# Patient Record
Sex: Female | Born: 1970 | Race: White | Hispanic: No | Smoking: Heavy tobacco smoker
Health system: Southern US, Community
[De-identification: ages and names within clinical notes are randomized; demographics above are authoritative.]

## PROBLEM LIST (undated history)

## (undated) DIAGNOSIS — N200 Calculus of kidney: Secondary | ICD-10-CM

## (undated) DIAGNOSIS — N939 Abnormal uterine and vaginal bleeding, unspecified: Secondary | ICD-10-CM

## (undated) DIAGNOSIS — E785 Hyperlipidemia, unspecified: Secondary | ICD-10-CM

## (undated) DIAGNOSIS — K219 Gastro-esophageal reflux disease without esophagitis: Secondary | ICD-10-CM

## (undated) HISTORY — DX: Calculus of kidney: N20.0

## (undated) HISTORY — DX: Hyperlipidemia, unspecified: E78.5

## (undated) HISTORY — DX: Gastro-esophageal reflux disease without esophagitis: K21.9

## (undated) HISTORY — DX: Abnormal uterine and vaginal bleeding, unspecified: N93.9

---

## 2005-04-04 ENCOUNTER — Emergency Department: Payer: Self-pay | Admitting: Emergency Medicine

## 2006-04-27 ENCOUNTER — Ambulatory Visit: Payer: Self-pay

## 2007-08-08 ENCOUNTER — Ambulatory Visit: Payer: Self-pay | Admitting: Family Medicine

## 2007-08-21 ENCOUNTER — Ambulatory Visit: Payer: Self-pay | Admitting: Family Medicine

## 2007-09-13 ENCOUNTER — Ambulatory Visit: Payer: Self-pay | Admitting: Gastroenterology

## 2008-01-23 ENCOUNTER — Emergency Department: Payer: Self-pay | Admitting: Emergency Medicine

## 2008-01-23 ENCOUNTER — Other Ambulatory Visit: Payer: Self-pay

## 2008-01-23 ENCOUNTER — Ambulatory Visit: Payer: Self-pay

## 2008-01-31 ENCOUNTER — Ambulatory Visit: Payer: Self-pay | Admitting: Nurse Practitioner

## 2009-01-06 ENCOUNTER — Ambulatory Visit: Payer: Self-pay | Admitting: Family Medicine

## 2009-07-03 ENCOUNTER — Ambulatory Visit: Payer: Self-pay | Admitting: Family Medicine

## 2009-07-07 ENCOUNTER — Ambulatory Visit: Payer: Self-pay | Admitting: Nurse Practitioner

## 2010-05-09 HISTORY — PX: CHOLECYSTECTOMY: SHX55

## 2010-09-03 ENCOUNTER — Emergency Department: Payer: Self-pay | Admitting: Emergency Medicine

## 2010-09-16 ENCOUNTER — Ambulatory Visit: Payer: Self-pay | Admitting: Surgery

## 2011-10-31 ENCOUNTER — Ambulatory Visit: Payer: Self-pay

## 2012-11-06 ENCOUNTER — Ambulatory Visit: Payer: Self-pay

## 2012-11-07 ENCOUNTER — Ambulatory Visit: Payer: Self-pay

## 2013-05-14 ENCOUNTER — Ambulatory Visit: Payer: Self-pay

## 2013-07-18 DIAGNOSIS — E7800 Pure hypercholesterolemia, unspecified: Secondary | ICD-10-CM | POA: Insufficient documentation

## 2013-11-13 ENCOUNTER — Ambulatory Visit: Payer: Self-pay

## 2013-12-11 ENCOUNTER — Emergency Department: Payer: Self-pay | Admitting: Emergency Medicine

## 2013-12-11 LAB — COMPREHENSIVE METABOLIC PANEL
ANION GAP: 9 (ref 7–16)
Albumin: 3.9 g/dL (ref 3.4–5.0)
Alkaline Phosphatase: 81 U/L
BILIRUBIN TOTAL: 0.4 mg/dL (ref 0.2–1.0)
BUN: 10 mg/dL (ref 7–18)
CHLORIDE: 104 mmol/L (ref 98–107)
CREATININE: 0.88 mg/dL (ref 0.60–1.30)
Calcium, Total: 8.4 mg/dL — ABNORMAL LOW (ref 8.5–10.1)
Co2: 26 mmol/L (ref 21–32)
EGFR (African American): >60
EGFR (Non-African Amer.): >60
GLUCOSE: 102 mg/dL — AB (ref 65–99)
Osmolality: 277 (ref 275–301)
Potassium: 3.9 mmol/L (ref 3.5–5.1)
SGOT(AST): 16 U/L (ref 15–37)
SGPT (ALT): 35 U/L
SODIUM: 139 mmol/L (ref 136–145)
TOTAL PROTEIN: 7.3 g/dL (ref 6.4–8.2)

## 2013-12-11 LAB — CBC
HCT: 46.1 % (ref 35.0–47.0)
HGB: 15.1 g/dL (ref 12.0–16.0)
MCH: 30.3 pg (ref 26.0–34.0)
MCHC: 32.8 g/dL (ref 32.0–36.0)
MCV: 92 fL (ref 80–100)
Platelet: 269 10*3/uL (ref 150–440)
RBC: 4.99 10*6/uL (ref 3.80–5.20)
RDW: 13.1 % (ref 11.5–14.5)
WBC: 14.3 10*3/uL — ABNORMAL HIGH (ref 3.6–11.0)

## 2013-12-11 LAB — URINALYSIS, COMPLETE
BACTERIA: NONE SEEN
Bilirubin,UR: NEGATIVE
Blood: NEGATIVE
GLUCOSE, UR: NEGATIVE mg/dL (ref 0–75)
Ketone: NEGATIVE
Leukocyte Esterase: NEGATIVE
NITRITE: NEGATIVE
PROTEIN: NEGATIVE
Ph: 6 (ref 4.5–8.0)
RBC,UR: 12 /HPF (ref 0–5)
SPECIFIC GRAVITY: 1.024 (ref 1.003–1.030)
WBC UR: NONE SEEN /HPF (ref 0–5)

## 2013-12-11 LAB — PREGNANCY, URINE: PREGNANCY TEST, URINE: NEGATIVE m[IU]/mL

## 2014-09-02 ENCOUNTER — Emergency Department: Admit: 2014-09-02 | Discharge status: Against Medical Advice | Payer: Self-pay | Admitting: Emergency Medicine

## 2014-09-02 LAB — BASIC METABOLIC PANEL
Anion Gap: 7 (ref 7–16)
BUN: 16 mg/dL
CALCIUM: 9.3 mg/dL
CREATININE: 1 mg/dL
Chloride: 105 mmol/L
Co2: 25 mmol/L
EGFR (Non-African Amer.): >60
Glucose: 109 mg/dL — ABNORMAL HIGH
POTASSIUM: 3.9 mmol/L
Sodium: 137 mmol/L

## 2014-09-02 LAB — URINALYSIS, COMPLETE
Bacteria: NONE SEEN
Bilirubin,UR: NEGATIVE
Glucose,UR: NEGATIVE mg/dL (ref 0–75)
Ketone: NEGATIVE
Leukocyte Esterase: NEGATIVE
Nitrite: NEGATIVE
PH: 5 (ref 4.5–8.0)
Specific Gravity: 1.029 (ref 1.003–1.030)

## 2014-09-02 LAB — PREGNANCY, URINE: Pregnancy Test, Urine: NEGATIVE m[IU]/mL

## 2014-09-02 LAB — CBC WITH DIFFERENTIAL/PLATELET
BASOS ABS: 0 10*3/uL (ref 0.0–0.1)
BASOS PCT: 0.4 %
Eosinophil #: 0.1 10*3/uL (ref 0.0–0.7)
Eosinophil %: 1.2 %
HCT: 46.5 % (ref 35.0–47.0)
HGB: 15.5 g/dL (ref 12.0–16.0)
LYMPHS ABS: 4.4 10*3/uL — AB (ref 1.0–3.6)
LYMPHS PCT: 39.1 %
MCH: 30 pg (ref 26.0–34.0)
MCHC: 33.2 g/dL (ref 32.0–36.0)
MCV: 90 fL (ref 80–100)
Monocyte #: 0.5 x10 3/mm (ref 0.2–0.9)
Monocyte %: 4.1 %
NEUTROS ABS: 6.2 10*3/uL (ref 1.4–6.5)
NEUTROS PCT: 55.2 %
PLATELETS: 297 10*3/uL (ref 150–440)
RBC: 5.15 10*6/uL (ref 3.80–5.20)
RDW: 13.2 % (ref 11.5–14.5)
WBC: 11.1 10*3/uL — AB (ref 3.6–11.0)

## 2014-09-03 ENCOUNTER — Ambulatory Visit
Admit: 2014-09-03 | Disposition: A | Discharge status: Home / Self Care | Payer: Self-pay | Attending: Urology | Admitting: Urology

## 2014-09-03 ENCOUNTER — Ambulatory Visit: Admit: 2014-09-03 | Disposition: A | Discharge status: Home / Self Care | Payer: Self-pay | Admitting: Urology

## 2014-09-03 LAB — PREGNANCY, URINE: Pregnancy Test, Urine: NEGATIVE m[IU]/mL

## 2014-09-04 ENCOUNTER — Ambulatory Visit
Admit: 2014-09-04 | Disposition: A | Discharge status: Home / Self Care | Payer: Self-pay | Attending: Urology | Admitting: Urology

## 2014-09-11 ENCOUNTER — Ambulatory Visit
Admission: RE | Admit: 2014-09-11 | Discharge: 2014-09-11 | Disposition: A | Discharge status: Home / Self Care | Payer: 59 | Source: Ambulatory Visit | Attending: Obstetrics and Gynecology | Admitting: Obstetrics and Gynecology

## 2014-09-11 ENCOUNTER — Other Ambulatory Visit: Payer: Self-pay | Admitting: Urology

## 2014-09-11 ENCOUNTER — Ambulatory Visit
Admission: RE | Admit: 2014-09-11 | Discharge: 2014-09-11 | Disposition: A | Discharge status: Home / Self Care | Payer: 59 | Attending: Obstetrics and Gynecology | Admitting: Obstetrics and Gynecology

## 2014-09-11 ENCOUNTER — Other Ambulatory Visit: Payer: Self-pay | Admitting: Obstetrics and Gynecology

## 2014-09-11 DIAGNOSIS — N200 Calculus of kidney: Secondary | ICD-10-CM | POA: Diagnosis not present

## 2014-10-09 ENCOUNTER — Ambulatory Visit (INDEPENDENT_AMBULATORY_CARE_PROVIDER_SITE_OTHER): Payer: 59 | Admitting: Urology

## 2014-10-09 ENCOUNTER — Encounter: Payer: Self-pay | Admitting: Urology

## 2014-10-09 VITALS — BP 123/84 | HR 86 | Ht 64.0 in | Wt 178.8 lb

## 2014-10-09 DIAGNOSIS — N200 Calculus of kidney: Secondary | ICD-10-CM | POA: Diagnosis not present

## 2014-10-09 LAB — MICROSCOPIC EXAMINATION: BACTERIA UA: NONE SEEN

## 2014-10-09 LAB — URINALYSIS, COMPLETE
BILIRUBIN UA: NEGATIVE
GLUCOSE, UA: NEGATIVE
Ketones, UA: NEGATIVE
Leukocytes, UA: NEGATIVE
Nitrite, UA: NEGATIVE
PROTEIN UA: NEGATIVE
RBC, UA: NEGATIVE
Specific Gravity, UA: 1.015 (ref 1.005–1.030)
Urobilinogen, Ur: 0.2 mg/dL (ref 0.2–1.0)
pH, UA: 6 (ref 5.0–7.5)

## 2014-10-09 MED ORDER — LEVOFLOXACIN 500 MG PO TABS: 500.0000 mg | ORAL_TABLET | Freq: Every day | ORAL | Status: DC

## 2014-10-09 NOTE — Progress Notes (Signed)
10/09/2014 11:24 AM   Earna Coder Seiter 1971/04/02 [id number]  Referring provider: Marinda Elk, MD [facility] [place], [place] [address]  Chief Complaint  Patient presents with  . Nephrolithiasis    HPI:   Patient has lower right quadrant pain and pressure  The UA is normal with no blood  Or bacturia   PMH: No past medical history on file.  Surgical History: No past surgical history on file.  Home Medications:    Medication List       This list is accurate as of: 10/09/14 11:24 AM.  Always use your most recent med list.               Cholecalciferol 1000 UNITS tablet  Take by mouth.     esomeprazole 20 MG capsule  Commonly known as:  NEXIUM  Take 20 mg by mouth daily at 12 noon.     levofloxacin 500 MG tablet  Commonly known as:  LEVAQUIN  Take 1 tablet (500 mg total) by mouth daily.     levonorgestrel 20 MCG/24HR IUD  Commonly known as:  MIRENA  by Intrauterine route.     lovastatin 40 MG tablet  Commonly known as:  MEVACOR  Take by mouth.        Allergies:  Allergies  Allergen Reactions  . Penicillins Anaphylaxis  . Codeine Other (See Comments)    Stimulation/paradoxical agitation  . Hydrocodone Other (See Comments)    Paradoxical agitation  . Morphine Other (See Comments)    agitation  . Promethazine Hcl Other (See Comments)    Family History: No family history on file.  Social History:  reports that she has been smoking.  She does not have any smokeless tobacco history on file. She reports that she drinks about 0.6 oz of alcohol per week. She reports that she does not use illicit drugs.  ROS: Urological Symptom Review  Patient is experiencing the following symptoms: Have to strain to urinate:   Review of Systems  Gastrointestinal (upper)  : Negative for upper GI symptoms  Gastrointestinal (lower) : Negative for lower GI symptoms  Constitutional : Negative for symptoms  Skin: Negative for  skin symptoms  Eyes: Negative for eye symptoms  Ear/Nose/Throat : Negative for Ear/Nose/Throat symptoms  Hematologic/Lymphatic: Negative for Hematologic/Lymphatic symptoms  Cardiovascular : Negative for cardiovascular symptoms  Respiratory : Negative for respiratory symptoms  Endocrine: Negative for endocrine symptoms  Musculoskeletal: Negative for musculoskeletal symptoms  Neurological: Negative for neurological symptoms  Psychologic: Negative for psychiatric symptoms   Physical Exam: BP 123/84 mmHg  Pulse 86  Ht 5\' 4"  (1.626 m)  Wt 178 lb 12.8 oz (81.103 kg)  BMI 30.68 kg/m2  LMP  (LMP Unknown)  Constitutional:  Alert and oriented, No acute distress. HEENT: [place] AT, moist mucus membranes.  Trachea midline, no masses. Cardiovascular: No clubbing, cyanosis, or edema. Respiratory: Normal respiratory effort, no increased work of breathing. GI: Abdomen is soft, nontender, nondistended, no abdominal masses GU: No CVA tenderness.  Skin: No rashes, bruises or suspicious lesions. Lymph: No cervical or inguinal adenopathy. Neurologic: Grossly intact, no focal deficits, moving all 4 extremities. Psychiatric: Normal mood and affect.  Laboratory Data: Lab Results  Component Value Date   WBC 11.1* 09/02/2014   HGB 15.5 09/02/2014   HCT 46.5 09/02/2014   MCV 90 09/02/2014   PLT 297 09/02/2014    Lab Results  Component Value Date   CREATININE 1.00 09/02/2014    No results  found for: PSA  No results found for: TESTOSTERONE  No results found for: HGBA1C  Urinalysis No results found for: COLORURINE, APPEARANCEUR, LABSPEC, PHURINE, GLUCOSEU, HGBUR, BILIRUBINUR, KETONESUR, PROTEINUR, UROBILINOGEN, NITRITE, LEUKOCYTESUR  Pertinent Imaging: none  Assessment & Plan:  levaquin 500mg  daily x14  litholink for calculous analysis      Problem List Items Addressed This Visit    None    Visit Diagnoses    Kidney stones    -  Primary    Relevant Orders     Urinalysis, Complete       No Follow-up on file.  [facility] [address] [place], [place] [address] [phone]

## 2014-11-06 ENCOUNTER — Other Ambulatory Visit: Payer: Self-pay | Admitting: Obstetrics and Gynecology

## 2014-11-06 DIAGNOSIS — Z1231 Encounter for screening mammogram for malignant neoplasm of breast: Secondary | ICD-10-CM

## 2014-11-17 ENCOUNTER — Ambulatory Visit
Admission: RE | Admit: 2014-11-17 | Discharge: 2014-11-17 | Disposition: A | Discharge status: Home / Self Care | Payer: 59 | Source: Ambulatory Visit | Attending: Obstetrics and Gynecology | Admitting: Obstetrics and Gynecology

## 2014-11-17 DIAGNOSIS — Z1231 Encounter for screening mammogram for malignant neoplasm of breast: Secondary | ICD-10-CM | POA: Diagnosis present

## 2014-12-24 ENCOUNTER — Other Ambulatory Visit: Payer: Self-pay

## 2014-12-24 DIAGNOSIS — N200 Calculus of kidney: Secondary | ICD-10-CM

## 2015-02-26 ENCOUNTER — Other Ambulatory Visit: Payer: Self-pay | Admitting: Physician Assistant

## 2015-02-26 DIAGNOSIS — R1030 Lower abdominal pain, unspecified: Secondary | ICD-10-CM

## 2015-02-27 ENCOUNTER — Ambulatory Visit
Admission: RE | Admit: 2015-02-27 | Discharge: 2015-02-27 | Disposition: A | Discharge status: Home / Self Care | Payer: 59 | Source: Ambulatory Visit | Attending: Physician Assistant | Admitting: Physician Assistant

## 2015-02-27 DIAGNOSIS — R1030 Lower abdominal pain, unspecified: Secondary | ICD-10-CM

## 2015-02-27 DIAGNOSIS — N200 Calculus of kidney: Secondary | ICD-10-CM | POA: Diagnosis not present

## 2015-03-02 ENCOUNTER — Other Ambulatory Visit: Payer: 59

## 2015-03-03 ENCOUNTER — Encounter: Payer: Self-pay | Admitting: *Deleted

## 2015-03-03 DIAGNOSIS — F32A Depression, unspecified: Secondary | ICD-10-CM | POA: Insufficient documentation

## 2015-03-03 DIAGNOSIS — F329 Major depressive disorder, single episode, unspecified: Secondary | ICD-10-CM | POA: Insufficient documentation

## 2015-03-03 DIAGNOSIS — E559 Vitamin D deficiency, unspecified: Secondary | ICD-10-CM | POA: Insufficient documentation

## 2015-03-03 DIAGNOSIS — K859 Acute pancreatitis without necrosis or infection, unspecified: Secondary | ICD-10-CM | POA: Insufficient documentation

## 2015-03-03 DIAGNOSIS — N200 Calculus of kidney: Secondary | ICD-10-CM | POA: Insufficient documentation

## 2015-03-03 DIAGNOSIS — E7800 Pure hypercholesterolemia, unspecified: Secondary | ICD-10-CM | POA: Insufficient documentation

## 2015-03-03 DIAGNOSIS — E669 Obesity, unspecified: Secondary | ICD-10-CM | POA: Insufficient documentation

## 2015-03-03 DIAGNOSIS — F419 Anxiety disorder, unspecified: Secondary | ICD-10-CM | POA: Insufficient documentation

## 2015-03-04 ENCOUNTER — Encounter: Payer: Self-pay | Admitting: Obstetrics and Gynecology

## 2015-03-04 ENCOUNTER — Ambulatory Visit (INDEPENDENT_AMBULATORY_CARE_PROVIDER_SITE_OTHER): Payer: 59 | Admitting: Obstetrics and Gynecology

## 2015-03-04 VITALS — BP 116/64 | HR 68 | Resp 16 | Ht 64.0 in | Wt 175.5 lb

## 2015-03-04 DIAGNOSIS — R102 Pelvic and perineal pain: Secondary | ICD-10-CM

## 2015-03-04 DIAGNOSIS — N200 Calculus of kidney: Secondary | ICD-10-CM | POA: Diagnosis not present

## 2015-03-04 DIAGNOSIS — N9489 Other specified conditions associated with female genital organs and menstrual cycle: Secondary | ICD-10-CM

## 2015-03-04 LAB — URINALYSIS, COMPLETE
Bilirubin, UA: NEGATIVE
Glucose, UA: NEGATIVE
KETONES UA: NEGATIVE
LEUKOCYTES UA: NEGATIVE
Nitrite, UA: NEGATIVE
PROTEIN UA: NEGATIVE
UUROB: 0.2 mg/dL (ref 0.2–1.0)
pH, UA: 5.5 (ref 5.0–7.5)

## 2015-03-04 LAB — MICROSCOPIC EXAMINATION
Bacteria, UA: NONE SEEN
Renal Epithel, UA: NONE SEEN /hpf
WBC UA: NONE SEEN /HPF (ref 0–?)

## 2015-03-04 NOTE — Progress Notes (Signed)
03/04/2015 9:16 AM   Earna Coder Berkovich [birth year] [id number]  Referring provider: Marinda Elk, MD [facility] [place], [place] [address]  Chief Complaint  Patient presents with  . Results    CT  . Nephrolithiasis    HPI: Patient is a 44 year old female with a history of renal calculi presenting today with complaints of recurrent vaginal/bladder pressure and to review his CT results ordered by her primary care provider. She reports small volume voids but denies any changes in urinary symptoms. No gross hematuria, fevers or flank pain.  CT showing no obstruction and stable left renal calculi.  PMH: No past medical history on file.  Surgical History: No past surgical history on file.  Home Medications:    Medication List       This list is accurate as of: 03/04/15  9:16 AM.  Always use your most recent med list.               Cholecalciferol 1000 UNITS tablet  Take by mouth.     esomeprazole 20 MG capsule  Commonly known as:  NEXIUM  Take 20 mg by mouth daily at 12 noon.     levofloxacin 500 MG tablet  Commonly known as:  LEVAQUIN  Take 1 tablet (500 mg total) by mouth daily.     levonorgestrel 20 MCG/24HR IUD  Commonly known as:  MIRENA  by Intrauterine route.     lovastatin 40 MG tablet  Commonly known as:  MEVACOR  Take by mouth.        Allergies:  Allergies  Allergen Reactions  . Penicillins Anaphylaxis  . Codeine Other (See Comments)    Stimulation/paradoxical agitation  . Hydrocodone Other (See Comments)    Paradoxical agitation  . Morphine Other (See Comments)    agitation  . Promethazine Hcl Other (See Comments)    Family History: No family history on file.  Social History:  reports that she has been smoking.  She does not have any smokeless tobacco history on file. She reports that she drinks about 0.6 oz of alcohol per week. She reports that she does not use illicit drugs.  ROS: UROLOGY Frequent  Urination?: No Hard to postpone urination?: No Burning/pain with urination?: No Get up at night to urinate?: Yes Leakage of urine?: No Urine stream starts and stops?: No Trouble starting stream?: No Do you have to strain to urinate?: No Blood in urine?: No Urinary tract infection?: No Sexually transmitted disease?: No Injury to kidneys or bladder?: No Painful intercourse?: No Weak stream?: No Currently pregnant?: No Vaginal bleeding?: No Last menstrual period?: n  Gastrointestinal Nausea?: No Vomiting?: No Indigestion/heartburn?: No Diarrhea?: No Constipation?: No  Constitutional Fever: No Night sweats?: No Weight loss?: No Fatigue?: No  Skin Skin rash/lesions?: No Itching?: No  Eyes Blurred vision?: No Double vision?: No  Ears/Nose/Throat Sore throat?: No Sinus problems?: No  Hematologic/Lymphatic Swollen glands?: No Easy bruising?: No  Cardiovascular Leg swelling?: No Chest pain?: No  Respiratory Cough?: No Shortness of breath?: No  Endocrine Excessive thirst?: No  Musculoskeletal Back pain?: No Joint pain?: No  Neurological Headaches?: No Dizziness?: No  Psychologic Depression?: No Anxiety?: No  Physical Exam: BP 116/64 mmHg  Pulse 68  Resp 16  Ht 5\' 4"  (1.626 m)  Wt 175 lb 8 oz (79.606 kg)  BMI 30.11 kg/m2  Constitutional:  Alert and oriented, No acute distress. HEENT: [place] AT, moist mucus membranes.  Trachea midline, no masses. Cardiovascular: No clubbing, cyanosis,  or edema. Respiratory: Normal respiratory effort, no increased work of breathing. GI: Abdomen is soft, nontender, nondistended, no abdominal masses GU: No CVA tenderness. Skin: No rashes, bruises or suspicious lesions. Neurologic: Grossly intact, no focal deficits, moving all 4 extremities. Psychiatric: Normal mood and affect.  Laboratory Data:   Urinalysis    Component Value Date/Time   COLORURINE Yellow 09/02/2014 0125   APPEARANCEUR Hazy 09/02/2014 0125    LABSPEC 1.029 09/02/2014 0125   PHURINE 5.0 09/02/2014 0125   GLUCOSEU Negative 10/09/2014 1055   GLUCOSEU Negative 09/02/2014 0125   HGBUR 3+ 09/02/2014 0125   BILIRUBINUR Negative 10/09/2014 1055   BILIRUBINUR Negative 09/02/2014 0125   KETONESUR Negative 09/02/2014 0125   PROTEINUR 30 mg/dL 09/02/2014 0125   NITRITE Negative 10/09/2014 1055   NITRITE Negative 09/02/2014 0125   LEUKOCYTESUR Negative 10/09/2014 1055   LEUKOCYTESUR Negative 09/02/2014 0125    Pertinent Imaging: EXAM: CT ABDOMEN AND PELVIS WITHOUT CONTRAST  TECHNIQUE: Multidetector CT imaging of the abdomen and pelvis was performed following the standard protocol without IV contrast.  COMPARISON: 09/02/2014  FINDINGS: Right hydronephrosis and distal right ureteral calculus have resolved. Stable pelvic phleboliths. Stable calculus in the lower pole of the left kidney.  Visualized liver, spleen, pancreas, and adrenal glands are within normal limits  Postcholecystectomy  IUD is stable within the endometrium. Unremarkable bladder and adnexa. There are cystic changes in the right ovary.  Sigmoid diverticulosis.  No vertebral compression.  IMPRESSION: Previously visualized distal right ureteral calculus and right hydronephrosis have resolved.  Stable left nephrolithiasis.   Electronically Signed  By: Marybelle Killings M.D.  On: 02/27/2015 12:44          Assessment & Plan:    1. Nephrolithiasis- Stable small calculus in the lower pole of the left kidney. No hydronephrosis or hydroureter.  - Urinalysis, Complete  2. Pelvic Pressure- UA unremarkable today. PVR 44mL. Given patient's description of symptoms and continued recurrance. I suspect possible cystocele. She instructed to schedule an appointment with her established GYN provider for evaluation of possible bladder prolapse.    Return for as scheduled in MAY.  These notes generated with voice recognition software. I apologize  for typographical errors.  Herbert Moors, [facility] [address] [place], [place] [address] [phone]

## 2015-03-11 ENCOUNTER — Other Ambulatory Visit: Payer: Self-pay | Admitting: Urology

## 2015-03-16 ENCOUNTER — Encounter: Payer: Self-pay | Admitting: Physical Therapy

## 2015-03-16 ENCOUNTER — Ambulatory Visit: Payer: 59 | Attending: Obstetrics and Gynecology | Admitting: Physical Therapy

## 2015-03-16 DIAGNOSIS — Z7409 Other reduced mobility: Secondary | ICD-10-CM | POA: Diagnosis present

## 2015-03-16 DIAGNOSIS — R279 Unspecified lack of coordination: Secondary | ICD-10-CM | POA: Insufficient documentation

## 2015-03-16 NOTE — Patient Instructions (Signed)
   PELVIC FLOOR / KEGEL EXERCISES   Pelvic floor/ Kegel exercises are used to strengthen the muscles in the base of your pelvis that are responsible for supporting your pelvic organs and preventing urine/feces leakage. Based on your therapist's recommendations, they can be performed while standing, sitting, or lying down. Imagine pelvic floor area as a diamond with pelvic landmarks: top =pubic bone, bottom tip=tailbone, sides=sitting bones (ischial tuberosities).    Make yourself aware of this muscle group by using these cues while coordinating your breath:  Inhale, feel pelvic floor diamond area lower like hammock towards your feet and ribcage/belly expanding. Pause. Let the exhale naturally and feel your belly sink, abdominal muscles hugging in around you and you may notice the pelvic diamond draws upward towards your head forming a umbrella shape. Give a squeeze during the exhalation like you are stopping the flow of urine. If you are squeezing the buttock muscles, try to give 50% less effort.   Common Errors:  Breath holding: If you are holding your breath, you may be bearing down against your bladder instead of pulling it up. If you belly bulges up while you are squeezing, you are holding your breath. Be sure to breathe gently in and out while exercising. Counting out loud may help you avoid holding your breath.  Accessory muscle use: You should not see or feel other muscle movement when performing pelvic floor exercises. When done properly, no one can tell that you are performing the exercises. Keep the buttocks, belly and inner thighs relaxed.  Overdoing it: Your muscles can fatigue and stop working for you if you over-exercise. You may actually leak more or feel soreness at the lower abdomen or rectum.  YOUR HOME EXERCISE PROGRAM  LONG HOLDS: Position: on back with pillows under knees   Inhale and then exhale. Then squeeze the muscle and count aloud for 3 seconds. Rest with three long  breaths. (Be sure to let belly sink in with exhales and not push outward)  Perform 4 repetitions, 3 times/day                        DECREASE DOWNWARD PRESSURE ON  YOUR PELVIC FLOOR, ABDOMINAL, LOW BACK MUSCLES       PRESERVE YOUR PELVIC HEALTH LONG-TERM   ** SQUEEZE pelvic floor BEFORE YOUR SNEEZE, COUGH, LAUGH   ** EXHALE BEFORE YOU RISE AGAINST GRAVITY (lifting, sit to stand, from squat to stand)   ** LOG ROLL OUT OF BED INSTEAD OF CRUNCH/SIT-UP                   Preserve the function of your pelvic floor, abdomen, and back.              Avoid decreased straining of abdominal/pelvic floor muscles with less  slouching,  holding your breath with lifting/bowel movements)                                                     FUNCTIONAL POSTURES

## 2015-03-17 NOTE — Therapy (Signed)
[place] MAIN [facility] SERVICES [address] [place], [place], [address] Phone: [phone]   Fax:  [phone]  Physical Therapy Evaluation  Patient Details  Name: Teresa Moss MRN: [id number] Date of Birth: 1970/12/16 Referring Provider: Newman Nip  Encounter Date: 03/16/2015      PT End of Session - 03/17/15 2017    Visit Number 1   Number of Visits 12   Date for PT Re-Evaluation 05/25/15   PT Start Time 1550   PT Stop Time 1700   PT Time Calculation (min) 70 min   Activity Tolerance Patient tolerated treatment well;No increased pain   Behavior During Therapy [facility] for tasks assessed/performed      History reviewed. No pertinent past medical history.  Past Surgical History  Procedure Laterality Date  . Cholecystectomy      There were no vitals filed for this visit.  Visit Diagnosis:  Lack of coordination - Plan: PT plan of care cert/re-cert  Mobility impaired - Plan: PT plan of care cert/re-cert      Subjective Assessment - 03/16/15 1605    Subjective Pt reported SUI since 1 year ago that occured sneezing, coughing, laughing. Pt reports not completely emptying with urination. Denied urinary pad wear.  Pt stated she also has pressure feeling in perineal area that started three weeks ago that has become constant. Prior to this time, the pressure feeling occurred mainly with kidney stones. However,  pt was cleared of kidney stones. This sensation occurs  less  in intensity with walking and more symptomatic with sitting. Pt has a sedentary desk job for 8 hrs.  Denied  pain nor dyspareunia. Bowel movements 2x/day  with Stool Type of 1--6 with Type 6 being more prominent.    Pertinent History Hx of kidney stones, gall bladder removal 2012 after which bowel movements have worsened with looser stools. Hx of vaginal delivery with tearing and use of forceps. Hx of doing pilates for 6-7 years and worked with fitness trainer doing weight training and  core exercises.    Patient Stated Goals  Pt wants to not plan out her day seeking toilets accessibility             [facility] PT Assessment - 03/17/15 1444    Assessment   Medical Diagnosis SUI   Referring Provider [place]   Precautions   Precautions None   Restrictions   Weight Bearing Restrictions No   Prior Function   Level of Independence Independent   Observation/Other Assessments   Other Surveys  --  PFDI 44%   Coordination   Gross Motor Movements are Fluid and Coordinated --  chest breathing, abdominal/pelvic straining w/ cue for BM    Fine Motor Movements are Fluid and Coordinated --  lumbopelvic instability w/ lifting foot off plinth in hookly   Posture/Postural Control   Posture Comments increased anterior tilt of pelvic, hyperextended knees (pt reported she wears 5" heels everyday), increased thoracic kyphosis w/ ribcage more posterior to pelvis   AROM   Overall AROM  Within functional limits for tasks performed;Deficits   Strength   Overall Strength Comments 4-/5 bil hip abd, 5/5 with cue for deep core engaged, hip ext 5/5 bilaterally    Bed Mobility   Bed Mobility --  crunch out of bed, able to log roll                  Pelvic Floor Special Questions - 03/17/15 2008    Prolapse other noted  abnormal pelvic organ position outside of introitus with cue for bowel movement and coughing, able to correct without lowering pelvic organ    Pelvic Floor Internal Exam pt consented verbally without contraindications   Exam Type Vaginal   Strength weak squeeze, no lift  post-Tx: 3/5 more circumferential, less posterior activation   Strength # of reps 4   Strength # of seconds 3   Biofeedback decrease contraction w/ inhalation, increase diaphragmatic breathing with contraction on exhale          [facility] Adult PT Treatment/Exercise - 03/17/15 1444    Self-Care   Self-Care --  education    Neuro Re-ed    Neuro Re-ed Details  pelvic floor/ breathing coordination,  posture,    Manual Therapy   Internal Pelvic Floor thiele massage on 1/2nd layers to faciliate mm activation                PT Education - 03/17/15 2016    Education provided Yes   Education Details HEP, POC, anatomy/ physiology, goals   Person(s) Educated Patient   Methods Explanation;Demonstration;Tactile cues;Verbal cues;Handout   Comprehension Verbalized understanding;Returned demonstration             PT Long Term Goals - 03/17/15 2004    PT LONG TERM GOAL #1   Title Pt will decrease her PFDI score form 44% to > 34% in order to demo improved pelvic floor function for ADLs.   Time 12   Period Weeks   Status New   PT LONG TERM GOAL #2   Title Pt will demo no lumbopelvic instability with 10 reps of dynamic stabilization level 1-4 in order to progress to upright functional deep core strengthening to minimize risks for injuries.   Time 12   Period Weeks   Status New   PT LONG TERM GOAL #3   Title Pt will demo pelvic floor strength 4/7/7/7 in order to decrease pressure feeling and regain QOL.   Time 12   Period Weeks   Status New   PT LONG TERM GOAL #4   Title Pt will demo increased diaphragmatic excursion with decreased midback mm tensions and thoracic kyphosis in order to maintain upright posture for optimal pelvic floor function.   Time 12   Period Weeks   Status New   PT LONG TERM GOAL #5   Title Pt will demo log rolling across two visits without cuing in order to minimize risk for worsening prolapse.   Time 12   Period Weeks   Status New               Plan - 03/17/15 2018    Clinical Impression Statement Pt is a 44 yo female whose S & Sx are consistent with spinal limitation, poor posture/ deep core mm strengthe/endurance/coordination, increased thoracic mm tensions, poor body mechanics, abnormal position of pelvic organ with exertional movements, and lack of understanding on how to minimize downward pressure onto pelvic floor.  These deficits  limit her QOL because she is dependent on toilet accessibility when she goes out in the community.    Pt will benefit from skilled therapeutic intervention in order to improve on the following deficits Decreased activity tolerance;Decreased endurance;Decreased range of motion;Decreased strength;Hypomobility;Difficulty walking;Decreased safety awareness;Decreased coordination;Decreased mobility;Hypermobility;Increased muscle spasms;Postural dysfunction;Improper body mechanics;Increased fascial restricitons;Decreased knowledge of precautions;Impaired flexibility   Rehab Potential Good   PT Frequency 1x / week   PT Duration 12 weeks   PT Treatment/Interventions ADLs/Self Care Home Management;Biofeedback;Canalith Repostioning;Cryotherapy;Electrical Stimulation;Gait training;Traction;Ultrasound;Moist  Heat;Stair training;Functional mobility training;Therapeutic activities;Therapeutic exercise;Balance training;Neuromuscular re-education;Manual techniques;Orthotic Fit/Training;Patient/family education;Scar mobilization;Passive range of motion;Dry needling;Energy conservation;Taping   Consulted and Agree with Plan of Care Patient         Problem List Patient Active Problem List   Diagnosis Date Noted  . Anxiety 03/03/2015  . Clinical depression 03/03/2015  . Hypercholesterolemia 03/03/2015  . Calculus of kidney 03/03/2015  . Adiposity 03/03/2015  . Acute inflammation of the pancreas 03/03/2015  . Avitaminosis D 03/03/2015  . Pure hypercholesterolemia 07/18/2013    Jerl Mina  ,PT, DPT, E-RYT  03/17/2015, 8:27 PM  [place] MAIN [facility] SERVICES [address] [place], [place], [address] Phone: [phone]   Fax:  [phone]  Name: ELIZABETH PAULSEN MRN: [id number] Date of Birth: 1970/11/29

## 2015-03-19 ENCOUNTER — Encounter: Payer: Self-pay | Admitting: Obstetrics and Gynecology

## 2015-03-19 ENCOUNTER — Ambulatory Visit (INDEPENDENT_AMBULATORY_CARE_PROVIDER_SITE_OTHER): Payer: 59 | Admitting: Obstetrics and Gynecology

## 2015-03-19 ENCOUNTER — Encounter: Payer: Self-pay | Admitting: *Deleted

## 2015-03-19 VITALS — BP 138/87 | HR 60 | Resp 16 | Ht 64.0 in | Wt 176.5 lb

## 2015-03-19 DIAGNOSIS — N200 Calculus of kidney: Secondary | ICD-10-CM | POA: Diagnosis not present

## 2015-03-19 NOTE — Progress Notes (Signed)
03/19/2015 11:33 AM   Teresa Moss 1971/02/06 [id number]  Referring provider: Marinda Elk, MD [facility] [place], [place] [address]  Chief Complaint  Patient presents with  . Nephrolithiasis  . Results    Litholink    HPI:   Patient is a 44 year old female with a history of nephrolithiasis presenting today to review her litholink results. She reports feeling well today and denies any urinary complaints.   PMH: Past Medical History  Diagnosis Date  . Kidney stone     Surgical History: Past Surgical History  Procedure Laterality Date  . Cholecystectomy      Home Medications:    Medication List       This list is accurate as of: 03/19/15 11:33 AM.  Always use your most recent med list.               Cholecalciferol 1000 UNITS tablet  Take by mouth.     esomeprazole 20 MG capsule  Commonly known as:  NEXIUM  Take 20 mg by mouth daily at 12 noon.     levonorgestrel 20 MCG/24HR IUD  Commonly known as:  MIRENA  by Intrauterine route.     lovastatin 40 MG tablet  Commonly known as:  MEVACOR  Take by mouth.        Allergies:  Allergies  Allergen Reactions  . Penicillins Anaphylaxis  . Codeine Other (See Comments)    Stimulation/paradoxical agitation  . Hydrocodone Other (See Comments)    Paradoxical agitation  . Morphine Other (See Comments)    agitation  . Promethazine Hcl Other (See Comments)    Family History: Family History  Problem Relation Age of Onset  . Heart disease Mother   . Heart disease Father     Social History:  reports that she has been smoking Cigarettes.  She has a 10 pack-year smoking history. She does not have any smokeless tobacco history on file. She reports that she drinks about 0.6 oz of alcohol per week. She reports that she does not use illicit drugs.  ROS: UROLOGY Frequent Urination?: No Hard to postpone urination?: No Burning/pain with urination?: No Get up at night to urinate?:  No Leakage of urine?: No Urine stream starts and stops?: No Trouble starting stream?: No Do you have to strain to urinate?: No Blood in urine?: No Urinary tract infection?: No Sexually transmitted disease?: No Injury to kidneys or bladder?: No Painful intercourse?: No Weak stream?: No Currently pregnant?: No Vaginal bleeding?: No Last menstrual period?: n  Gastrointestinal Nausea?: No Vomiting?: No Indigestion/heartburn?: No Diarrhea?: No Constipation?: No  Constitutional Fever: No Night sweats?: No Weight loss?: No Fatigue?: No  Skin Skin rash/lesions?: No Itching?: No  Eyes Blurred vision?: No Double vision?: No  Ears/Nose/Throat Sore throat?: No Sinus problems?: No  Hematologic/Lymphatic Swollen glands?: No Easy bruising?: No  Cardiovascular Leg swelling?: No Chest pain?: No  Respiratory Cough?: No Shortness of breath?: No  Endocrine Excessive thirst?: No  Musculoskeletal Back pain?: No Joint pain?: No  Neurological Headaches?: No Dizziness?: No  Psychologic Depression?: No Anxiety?: No  Physical Exam: BP 138/87 mmHg  Pulse 60  Resp 16  Ht 5\' 4"  (1.626 m)  Wt 176 lb 8 oz (80.06 kg)  BMI 30.28 kg/m2  Constitutional:  Alert and oriented, No acute distress. HEENT: [place] AT, moist mucus membranes.  Trachea midline, no masses. Cardiovascular: No clubbing, cyanosis, or edema. Respiratory: Normal respiratory effort, no increased work of breathing. Skin: No rashes, bruises  or suspicious lesions. Lymph: No cervical or inguinal adenopathy. Neurologic: Grossly intact, no focal deficits, moving all 4 extremities. Psychiatric: Normal mood and affect.  Laboratory Data:   Urinalysis    Component Value Date/Time   COLORURINE Yellow 09/02/2014 0125   APPEARANCEUR Hazy 09/02/2014 0125   LABSPEC 1.029 09/02/2014 0125   PHURINE 5.0 09/02/2014 0125   GLUCOSEU Negative 03/04/2015 0843   GLUCOSEU Negative 09/02/2014 0125   HGBUR 3+ 09/02/2014  0125   BILIRUBINUR Negative 03/04/2015 0843   BILIRUBINUR Negative 09/02/2014 0125   KETONESUR Negative 09/02/2014 0125   PROTEINUR 30 mg/dL 09/02/2014 0125   NITRITE Negative 03/04/2015 0843   NITRITE Negative 09/02/2014 0125   LEUKOCYTESUR Negative 03/04/2015 0843   LEUKOCYTESUR Negative 09/02/2014 0125    Pertinent Imaging:   Assessment & Plan:   1. Nephrolithiasis- Litholink results were reviewed with patient and she was encouraged to increase her daily water intake for a goal urine output of 2.5 L per day and to increase her dietary citrate.  Patient states that she does not have much citrate in her diet due to GERD flare ups.  I provided samples of Urocit K for patient to try and we will f/u in 1 month to check BMP and urine Ph.  Patient will begin taking 1 pill at night and increase dosage as tolerated.   We discussed general stone prevention techniques including drinking plenty water with goal of producing 2.5 L urine daily, increased citric acid intake, avoidance of high oxalate containing foods, and decreased salt intake.  Information about dietary recommendations given today.   There are no diagnoses linked to this encounter.  Return in about 1 month (around 04/18/2015) for BMP check;  follow up stones.  These notes generated with voice recognition software. I apologize for typographical errors.  Herbert Moors, [facility] [address] [place], [place] [address] [phone]

## 2015-03-23 ENCOUNTER — Ambulatory Visit: Payer: 59 | Admitting: Physical Therapy

## 2015-03-23 DIAGNOSIS — Z7409 Other reduced mobility: Secondary | ICD-10-CM

## 2015-03-23 DIAGNOSIS — R279 Unspecified lack of coordination: Secondary | ICD-10-CM | POA: Diagnosis not present

## 2015-03-24 NOTE — Patient Instructions (Signed)
Belly massage handout Standing posture handout  Pelvic floor training: apply less effort

## 2015-03-24 NOTE — Therapy (Signed)
[place] MAIN [facility] SERVICES [address] [place], [place], [address] Phone: [phone]   Fax:  [phone]  Physical Therapy Treatment  Patient Details  Name: Teresa Moss MRN: [id number] Date of Birth: 14-Jan-1971 Referring Provider: Newman Nip  Encounter Date: 03/23/2015      PT End of Session - 03/24/15 2304    Visit Number 2   Number of Visits 12   Date for PT Re-Evaluation 05/25/15   PT Start Time 1600   PT Stop Time [id number]   PT Time Calculation (min) 75 min   Activity Tolerance Patient tolerated treatment well;No increased pain   Behavior During Therapy [facility] for tasks assessed/performed      Past Medical History  Diagnosis Date  . Kidney stone     Past Surgical History  Procedure Laterality Date  . Cholecystectomy      There were no vitals filed for this visit.  Visit Diagnosis:  Lack of coordination  Mobility impaired      Subjective Assessment - 03/24/15 2300    Subjective Pt reported she noticed a little increased pressure that was short lasting after last session. Pt continued with HEP despite feeling unsure of correct form and technique. Pt moved furniture and did household chores which made her noticed increased back pain. Pt does not have back pain now because she did stretches.    Pertinent History Hx of kidney stones, gall bladder removal 2012 after which bowel movements have worsened with looser stools. Hx of vaginal delivery with tearing and use of forceps. Hx of doing pilates for 6-7 years and worked with fitness trainer doing weight training and core exercises.    Patient Stated Goals  Pt wants to not plan out her day seeking toilets accessibility                       Pelvic Floor Special Questions - 03/24/15 2300    Pelvic Floor Internal Exam pt consented verbally without contraindications   Exam Type Vaginal   Strength fair squeeze, definite lift   Strength # of reps 8   Strength # of seconds  3   Biofeedback improved coordination  cued for less effort, decreased bearing down on inhalation           [facility] Adult PT Treatment/Exercise - 03/24/15 2302    Self-Care   Self-Care --  nervous system education , down training   Neuro Re-ed    Neuro Re-ed Details  cue for decreased effort with pelvic floor training, tactile cues for standing (neutral spine, decrease lumbar lordosis  whole body relaxation training    Manual Therapy   Manual therapy comments badominal massage                 PT Education - 03/24/15 2302    Education provided Yes   Education Details HEP   Person(s) Educated Patient   Methods Explanation;Demonstration;Tactile cues;Verbal cues;Handout   Comprehension Returned demonstration;Verbalized understanding             PT Long Term Goals - 03/17/15 2004    PT LONG TERM GOAL #1   Title Pt will decrease her PFDI score form 44% to > 34% in order to demo improved pelvic floor function for ADLs.   Time 12   Period Weeks   Status New   PT LONG TERM GOAL #2   Title Pt will demo no lumbopelvic instability with 10 reps of dynamic stabilization level 1-4  in order to progress to upright functional deep core strengthening to minimize risks for injuries.   Time 12   Period Weeks   Status New   PT LONG TERM GOAL #3   Title Pt will demo pelvic floor strength 4/7/7/7 in order to decrease pressure feeling and regain QOL.   Time 12   Period Weeks   Status New   PT LONG TERM GOAL #4   Title Pt will demo increased diaphragmatic excursion with decreased midback mm tensions and thoracic kyphosis in order to maintain upright posture for optimal pelvic floor function.   Time 12   Period Weeks   Status New   PT LONG TERM GOAL #5   Title Pt will demo log rolling across two visits without cuing in order to minimize risk for worsening prolapse.   Time 12   Period Weeks   Status New               Plan - 03/24/15 2308    Clinical Impression  Statement Pt is progressing well with pelvic floor strengthening and posture training. Pt will continue to benefit from skilled PT. Plan to address body mechanics at next session.    Pt will benefit from skilled therapeutic intervention in order to improve on the following deficits Decreased activity tolerance;Decreased endurance;Decreased range of motion;Decreased strength;Hypomobility;Difficulty walking;Decreased safety awareness;Decreased coordination;Decreased mobility;Hypermobility;Increased muscle spasms;Postural dysfunction;Improper body mechanics;Increased fascial restricitons;Decreased knowledge of precautions;Impaired flexibility   Rehab Potential Good   PT Frequency 1x / week   PT Duration 12 weeks   PT Treatment/Interventions ADLs/Self Care Home Management;Biofeedback;Canalith Repostioning;Cryotherapy;Electrical Stimulation;Gait training;Traction;Ultrasound;Moist Heat;Stair training;Functional mobility training;Therapeutic activities;Therapeutic exercise;Balance training;Neuromuscular re-education;Manual techniques;Orthotic Fit/Training;Patient/family education;Scar mobilization;Passive range of motion;Dry needling;Energy conservation;Taping   Consulted and Agree with Plan of Care Patient        Problem List Patient Active Problem List   Diagnosis Date Noted  . Anxiety 03/03/2015  . Clinical depression 03/03/2015  . Hypercholesterolemia 03/03/2015  . Calculus of kidney 03/03/2015  . Adiposity 03/03/2015  . Acute inflammation of the pancreas 03/03/2015  . Avitaminosis D 03/03/2015  . Pure hypercholesterolemia 07/18/2013    Sara Chu, DPT, E-RYT  03/24/2015, 11:09 PM  [place] MAIN [facility] SERVICES [address] [place], [place], [address] Phone: [phone]   Fax:  [phone]  Name: Teresa Moss MRN: [id number] Date of Birth: [birth year]

## 2015-03-30 ENCOUNTER — Ambulatory Visit: Payer: 59 | Admitting: Physical Therapy

## 2015-03-30 DIAGNOSIS — R279 Unspecified lack of coordination: Secondary | ICD-10-CM

## 2015-03-30 DIAGNOSIS — Z7409 Other reduced mobility: Secondary | ICD-10-CM

## 2015-04-01 NOTE — Therapy (Signed)
[place] [facility] MAIN [facility] SERVICES [address] [place], [place], [address] Phone: [phone]   Fax:  [phone]  Physical Therapy Treatment  Patient Details  Name: Teresa Moss MRN: [id number] Date of Birth: 20-Oct-1970 Referring Provider: Tildon Husky  Encounter Date: 03/30/2015      PT End of Session - 04/01/15 0835    Visit Number 3   Number of Visits 12   Date for PT Re-Evaluation 05/25/15   PT Start Time 1610   PT Stop Time 1730   PT Time Calculation (min) 80 min   Activity Tolerance Patient tolerated treatment well;No increased pain   Behavior During Therapy [facility] for tasks assessed/performed      Past Medical History  Diagnosis Date  . Kidney stone     Past Surgical History  Procedure Laterality Date  . Cholecystectomy      There were no vitals filed for this visit.  Visit Diagnosis:  Lack of coordination  Mobility impaired      Subjective Assessment - 04/01/15 0829    Subjective Pt reported she felt "really really" good and  feels the pressure sensation  improved by 25% especially in sitting . Pt found HEP was easier and didn't have to think about how to do as much.             [facility] PT Assessment - 04/01/15 0001    Observation/Other Assessments   Other Surveys  --  PSQI 52%                   Pelvic Floor Special Questions - 04/01/15 0829    Pelvic Floor Internal Exam pt consented verbally without contraindications   Exam Type Vaginal   Strength fair squeeze, definite lift   Strength # of reps 8   Strength # of seconds 3   Biofeedback improved coordination, no pillow needed   cued for less effort to decrease strain on back,            [facility] Adult PT Treatment/Exercise - 04/01/15 0834    Exercises   Exercises --  see pt instructions    Manual Therapy   Internal Pelvic Floor thiele massage on 1/2nd layers to faciliate mm activation                PT Education - 04/01/15 0849     Education provided Yes   Education Details HEP, importance of down training nervous system, perspective on pelvic floor training with less exertion and how it is different from pilates and weight training   Person(s) Educated Patient   Methods Explanation;Demonstration;Tactile cues;Verbal cues;Handout   Comprehension Verbalized understanding;Returned demonstration             PT Long Term Goals - 04/01/15 0846    PT LONG TERM GOAL #1   Title Pt will decrease her PFDI score form 44% to > 34% in order to demo improved pelvic floor function for ADLs.   Time 12   Period Weeks   Status On-going   PT LONG TERM GOAL #2   Title Pt will demo no lumbopelvic instability with 10 reps of dynamic stabilization level 1-4 in order to progress to upright functional deep core strengthening to minimize risks for injuries.   Time 12   Period Weeks   Status Partially Met   PT LONG TERM GOAL #3   Title Pt will demo pelvic floor strength 4/7/7/7 in order to decrease pressure feeling and regain QOL.  Time 12   Period Weeks   Status On-going   PT LONG TERM GOAL #4   Title Pt will demo increased diaphragmatic excursion with decreased midback mm tensions and thoracic kyphosis in order to maintain upright posture for optimal pelvic floor function.   Time 12   Period Weeks   Status Achieved   PT LONG TERM GOAL #5   Title Pt will demo log rolling across two visits without cuing in order to minimize risk for worsening prolapse.   Time 12   Period Weeks   Status Achieved   Additional Long Term Goals   Additional Long Term Goals Yes   PT LONG TERM GOAL #6   Title Pt will decrease her score on PSQI from 11/21 pts  (52%) to < 8 /21 pts (38%)  in order to improve sleep.    Time 12   Period Weeks   Status New               Plan - 04/01/15 0836    Clinical Impression Statement Pt demonstrated improved pelvic floor activation with a more caudal position of bladder and more circumsferential  contraction with cuing for decreased effort to minimize posterior mm overactivity. Pt advanced to deep core strengthening and was provided pre-bed time routine stretches and calming practices.  Pt was educated about the importance of screening for sleep apnea for nocturia and snoring.  Anticipate pt will continue to progress twoards her goals as pt reports her "pressure sensation" has already improved with 25% and she does not feel it as much especially with sitting.    Pt will benefit from skilled therapeutic intervention in order to improve on the following deficits Decreased activity tolerance;Decreased endurance;Decreased range of motion;Decreased strength;Hypomobility;Difficulty walking;Decreased safety awareness;Decreased coordination;Decreased mobility;Hypermobility;Increased muscle spasms;Postural dysfunction;Improper body mechanics;Increased fascial restricitons;Decreased knowledge of precautions;Impaired flexibility   Rehab Potential Good   PT Frequency 1x / week   PT Duration 12 weeks   PT Treatment/Interventions ADLs/Self Care Home Management;Biofeedback;Canalith Repostioning;Cryotherapy;Electrical Stimulation;Gait training;Traction;Ultrasound;Moist Heat;Stair training;Functional mobility training;Therapeutic activities;Therapeutic exercise;Balance training;Neuromuscular re-education;Manual techniques;Orthotic Fit/Training;Patient/family education;Scar mobilization;Passive range of motion;Dry needling;Energy conservation;Taping   Consulted and Agree with Plan of Care Patient        Problem List Patient Active Problem List   Diagnosis Date Noted  . Anxiety 03/03/2015  . Clinical depression 03/03/2015  . Hypercholesterolemia 03/03/2015  . Calculus of kidney 03/03/2015  . Adiposity 03/03/2015  . Acute inflammation of the pancreas 03/03/2015  . Avitaminosis D 03/03/2015  . Pure hypercholesterolemia 07/18/2013    Jerl Mina ,PT, DPT, E-RYT  04/01/2015, 8:53 AM  [place] MAIN [facility] SERVICES [address] [place], [place], [address] Phone: [phone]   Fax:  [phone]  Name: LAURY HUIZAR MRN: [id number] Date of Birth: [birth year]

## 2015-04-01 NOTE — Patient Instructions (Signed)
Evening stretches:   BACK BUTTOCK MUSCLES: 5 breaths each  Figure -4   Cross thigh over other thigh and pull with a towel using opposite hand   MIDBACK RELEASING TENSIONS:  Open book (see sheet) 15 reps each side (keep shoudler down)     ____Practice these stretches during the day at work seated for the buttock stretches and standing by wall for "Open Book"   ____Calming breath  (roll tongue in shape of "canoe" and exhale out over tongue 10 reps    You are now ready to begin training the deep core muscles system: diaphragm, transverse abdominis, pelvic floor . These muscles must work together as a team.           The key to these exercises to train the brain to coordinate the timing of these muscles and to have them turn on for long periods of time to hold you upright against gravity (especially important if you are on your feet all day).These muscles are postural muscles and play a role stabilizing your spine and bodyweight. By doing these repetitions slowly and correctly instead of doing crunches, you will achieve a flatter belly without a lower pooch. You are also placing your spine in a more neutral position and breathing properly which in turn, decreases your risk for problems related to your pelvic floor, abdominal, and low back such as pelvic organ prolapse, hernias, diastasis recti (separation of superficial muscles), disk herniations, spinal fractures. These exercises set a solid foundation for you to later progress to resistance/ strength training with therabands and weights and return to other typical fitness exercises with a stronger deeper core.   Start with Level 1-2 only

## 2015-04-06 ENCOUNTER — Ambulatory Visit: Payer: 59 | Admitting: Physical Therapy

## 2015-04-06 DIAGNOSIS — R279 Unspecified lack of coordination: Secondary | ICD-10-CM

## 2015-04-06 DIAGNOSIS — Z7409 Other reduced mobility: Secondary | ICD-10-CM

## 2015-04-06 NOTE — Patient Instructions (Addendum)
Cardio and hip strengthening and deep core strength   Side step 20 ft with mini squat   (scoot butt back (knees behind toes), keep the core of feet / arch lifted on lowering squat, exhale while rising to stand )  1 lap is left and right. 4 laps   ______________________________   PELVIC FLOOR / KEGEL EXERCISES  Pelvic floor/ Kegel exercises are used to strengthen the muscles in the base of your pelvis that are responsible for supporting your pelvic organs and preventing urine/feces leakage. Based on your therapist's recommendations, they can be performed while standing, sitting, or lying down.  Make yourself aware of this muscle group by using these cues:  Imagine you are in a crowded room and you feel the need to pass gas. Your response is to pull up and in at the rectum.  Close the rectum. Pull the muscles up inside your body,feeling your vaginal walls lifting as well . Feel the pelvic floor muscles lift  Place your hand on top of your pubic bone. Tighten and draw in the muscles around the anal muscles without squeezing the buttock muscles. Common Errors:  Breath holding: If you are holding your breath, you may be bearing down against your bladder instead of pulling it up. If you belly bulges up while you are squeezing, you are holding your breath. Be sure to breathe gently in and out while exercising. Counting out loud may help you avoid holding your breath.  Accessory muscle use: You should not see or feel other muscle movement when performing pelvic floor exercises. When done properly, no one can tell that you are performing the exercises. Keep the buttocks, belly and inner thighs relaxed.  Overdoing it: Your muscles can fatigue and stop working for you if you over-exercise. You may actually leak more or feel soreness at the lower abdomen or rectum.  YOUR HOME EXERCISE PROGRAM  LONG HOLDS: Position: on back  Inhale and then exhale. Then squeeze the muscle and count aloud for 4 seconds. Rest  with three long breaths. (Be sure to let belly sink in with exhales and not push outward)   Perform 6 repetitions, 3 times/day      WAYS TO MINIMIZE STRAIN ON PELVIC FLOOR, ABDOMINALS,                      LOW BACK MUSCLES. [place]!   **SQUEEZE BEFORE YOUR SNEEZE, COUGH, LAUGH to decrease downward pressure  ** EXHALE BEFORE YOU RISE AGAINST GRAVITY (lifting, sit to stand, from squat to stand)  **LOG ROLL out of bed instead of performing a crunch/sit-up  _________________________  You are now ready to begin training the deep core muscles system: diaphragm, transverse abdominis, pelvic floor . These muscles must work together as a team.    Diaphragmatic breathing with pelvic floor movement in standing (unlocked knees and press more into pinky side of feet)   Singing "Ahhhh" when doing dishes or standing     The key to these exercises to train the brain to coordinate the timing of these muscles and to have them turn on for long periods of time to hold you upright against gravity (especially important if you are on your feet all day).These muscles are postural muscles and play a role stabilizing your spine and bodyweight. By doing these repetitions slowly and correctly instead of doing crunches, you will achieve a flatter belly without a lower pooch. You are also placing your spine in a more  neutral position and breathing properly which in turn, decreases your risk for problems related to your pelvic floor, abdominal, and low back such as pelvic organ prolapse, hernias, diastasis recti (separation of superficial muscles), disk herniations, spinal fractures. These exercises set a solid foundation for you to later progress to resistance/ strength training with therabands and weights and return to other typical fitness exercises with a stronger deeper core.       ___________________________________  Self-compassion reading material via email.

## 2015-04-07 NOTE — Therapy (Signed)
[place] MAIN [facility] SERVICES [address] [place], [place], [address] Phone: [phone]   Fax:  [phone]  Physical Therapy Treatment  Patient Details  Name: Teresa Moss MRN: [id number] Date of Birth: 1971/05/03 Referring Provider: Newman Nip  Encounter Date: 04/06/2015      PT End of Session - 04/07/15 1405    Visit Number 4   Number of Visits 12   Date for PT Re-Evaluation 05/25/15   PT Start Time 1600   PT Stop Time 1700   PT Time Calculation (min) 60 min   Activity Tolerance Patient tolerated treatment well   Behavior During Therapy [facility] for tasks assessed/performed      Past Medical History  Diagnosis Date  . Kidney stone     Past Surgical History  Procedure Laterality Date  . Cholecystectomy      There were no vitals filed for this visit.  Visit Diagnosis:  Lack of coordination  Mobility impaired      Subjective Assessment - 04/06/15 1605    Subjective Pt reported she felt "really really good" last week while practicing the new stretches. But over the holidays, pt was on her feet for long hours and she did feel the pressure feeling while being on her feet cooking and washing dishes at the end of the trip.               [facility] PT Assessment - 04/06/15 1623    Squat   Comments pronation of bilateral feet, cues required                   Pelvic Floor Special Questions - 04/07/15 1400    External Palpation no lowered position of bladder in standing   Pelvic Floor Internal Exam pt consented verbally without contraindications   Exam Type Vaginal   Strength fair squeeze, definite lift   Strength # of reps 6   Strength # of seconds 4   Biofeedback no pillow needed but required excessive cuing for decreased effort to faciliate endurance           [facility] Adult PT Treatment/Exercise - 04/07/15 1401    Self-Care   Self-Care --  see pt instruction, education    Neuro Re-ed    Neuro Re-ed Details   education on approach with pevlvic floor training, anatomy of third deep core diaphragm (glottis), replacing excessive effort with decreased effort, vocalizing to engage in deep core mm while doing dishes, standing.   cues for activating foot arch, related to pelvic floor    Exercises   Exercises --  see pt instructions                PT Education - 04/07/15 1405    Education provided Yes   Education Details HEP, biopsychosocial education    Person(s) Educated Patient   Methods Explanation;Demonstration;Tactile cues;Verbal cues;Handout   Comprehension Verbalized understanding;Returned demonstration             PT Long Term Goals - 04/01/15 0846    PT LONG TERM GOAL #1   Title Pt will decrease her PFDI score form 44% to > 34% in order to demo improved pelvic floor function for ADLs.   Time 12   Period Weeks   Status On-going   PT LONG TERM GOAL #2   Title Pt will demo no lumbopelvic instability with 10 reps of dynamic stabilization level 1-4 in order to progress to upright functional deep core strengthening to minimize  risks for injuries.   Time 12   Period Weeks   Status Partially Met   PT LONG TERM GOAL #3   Title Pt will demo pelvic floor strength 4/7/7/7 in order to decrease pressure feeling and regain QOL.   Time 12   Period Weeks   Status On-going   PT LONG TERM GOAL #4   Title Pt will demo increased diaphragmatic excursion with decreased midback mm tensions and thoracic kyphosis in order to maintain upright posture for optimal pelvic floor function.   Time 12   Period Weeks   Status Achieved   PT LONG TERM GOAL #5   Title Pt will demo log rolling across two visits without cuing in order to minimize risk for worsening prolapse.   Time 12   Period Weeks   Status Achieved   Additional Long Term Goals   Additional Long Term Goals Yes   PT LONG TERM GOAL #6   Title Pt will decrease her score on PSQI from 11/21 pts  (52%) to < 8 /21 pts (38%)  in order to  improve sleep.    Time 12   Period Weeks   Status New               Plan - 04/07/15 1406    Clinical Impression Statement Pt is progressing well with a biopsychosocial approach to deep core and pelvic floor mm strengthening. Pt demo'd increased awareness with pelvic floor activation related to lower kinetic chain in standing today.  Motivational interviewing was required to adjust HEP for aerobic routine to pt's interest.  Pt continues to benefit from skilled PT.    Pt will benefit from skilled therapeutic intervention in order to improve on the following deficits Decreased activity tolerance;Decreased endurance;Decreased range of motion;Decreased strength;Hypomobility;Difficulty walking;Decreased safety awareness;Decreased coordination;Decreased mobility;Hypermobility;Increased muscle spasms;Postural dysfunction;Improper body mechanics;Increased fascial restricitons;Decreased knowledge of precautions;Impaired flexibility   Rehab Potential Good   PT Frequency 1x / week   PT Duration 12 weeks   PT Treatment/Interventions ADLs/Self Care Home Management;Biofeedback;Canalith Repostioning;Cryotherapy;Electrical Stimulation;Gait training;Traction;Ultrasound;Moist Heat;Stair training;Functional mobility training;Therapeutic activities;Therapeutic exercise;Balance training;Neuromuscular re-education;Manual techniques;Orthotic Fit/Training;Patient/family education;Scar mobilization;Passive range of motion;Dry needling;Energy conservation;Taping   Consulted and Agree with Plan of Care Patient        Problem List Patient Active Problem List   Diagnosis Date Noted  . Anxiety 03/03/2015  . Clinical depression 03/03/2015  . Hypercholesterolemia 03/03/2015  . Calculus of kidney 03/03/2015  . Adiposity 03/03/2015  . Acute inflammation of the pancreas 03/03/2015  . Avitaminosis D 03/03/2015  . Pure hypercholesterolemia 07/18/2013    Jerl Mina ,PT, DPT, E-RYT  04/07/2015, 2:08  PM  [place] MAIN [facility] SERVICES [address] [place], [place], [address] Phone: [phone]   Fax:  [phone]  Name: Teresa Moss MRN: [id number] Date of Birth: 1970-08-16

## 2015-04-13 ENCOUNTER — Ambulatory Visit: Payer: 59 | Attending: Obstetrics and Gynecology | Admitting: Physical Therapy

## 2015-04-13 ENCOUNTER — Other Ambulatory Visit: Payer: 59

## 2015-04-13 DIAGNOSIS — Z7409 Other reduced mobility: Secondary | ICD-10-CM

## 2015-04-13 DIAGNOSIS — R279 Unspecified lack of coordination: Secondary | ICD-10-CM | POA: Diagnosis not present

## 2015-04-13 DIAGNOSIS — N200 Calculus of kidney: Secondary | ICD-10-CM

## 2015-04-13 LAB — URINALYSIS, COMPLETE
Bilirubin, UA: NEGATIVE
Glucose, UA: NEGATIVE
Ketones, UA: NEGATIVE
LEUKOCYTES UA: NEGATIVE
Nitrite, UA: NEGATIVE
PH UA: 7 (ref 5.0–7.5)
PROTEIN UA: NEGATIVE
RBC, UA: NEGATIVE
Specific Gravity, UA: 1.02 (ref 1.005–1.030)
Urobilinogen, Ur: 0.2 mg/dL (ref 0.2–1.0)

## 2015-04-13 LAB — MICROSCOPIC EXAMINATION

## 2015-04-13 NOTE — Therapy (Addendum)
[place] MAIN [facility] SERVICES [place], [place], [address] Phone: [phone]   Fax:  [phone]  Physical Therapy Treatment  Patient Details  Name: Teresa Moss MRN: [id number] Date of Birth: Feb 10, 1971 Referring Provider: Newman Nip  Encounter Date: 04/13/2015    Past Medical History  Diagnosis Date  . Kidney stone     Past Surgical History  Procedure Laterality Date  . Cholecystectomy      There were no vitals filed for this visit.  Visit Diagnosis:  Lack of coordination  Mobility impaired      Subjective Assessment - 04/13/15 1552    Subjective Pt reported she has been enjoying performing her stretches prior to bed and has been sleeping throughout the night. Pt has not had any leakage with coughing, sneezing, and laughing. Pt wanted to get back to lifting weights but she refrained.    Pertinent History Hx of kidney stones, gall bladder removal 2012 after which bowel movements have worsened with looser stools. Hx of vaginal delivery with tearing and use of forceps. Hx of doing pilates for 6-7 years and worked with fitness trainer doing weight training and core exercises.    Patient Stated Goals  Pt wants to not plan out her day seeking toilets accessibility             [facility] PT Assessment - 04/13/15 2054    Observation/Other Assessments   Observations slight difficulty with shoulder retraction/ shoulder depression  in new HEP    Other Surveys  --  PSQI 8/21   38%                   Pelvic Floor Special Questions - 04/13/15 2056    Pelvic Floor Internal Exam pt consented verbally without contraindications   Exam Type Vaginal   Strength fair squeeze, definite lift  3-/5    Strength # of reps 8   Strength # of seconds 4   Biofeedback endurane holds with posterior > anterior but less difficulty w/ coordination, Quick contraction with more circumferential contraction             [facility] Adult PT  Treatment/Exercise - 04/13/15 2105    Neuro Re-ed    Neuro Re-ed Details  seated quick contractions without difficulty,  modified theraband exercises with diffiiculty maintaining shoulder retraction. depression, downgraded to green band   Exercises   Exercises --  pelvic floor progressions                     PT Long Term Goals - 04/13/15 2106    PT LONG TERM GOAL #1   Title Pt will decrease her PFDI score form 44% to > 34% in order to demo improved pelvic floor function for ADLs.   Time 12   Period Weeks   Status Partially Met   PT LONG TERM GOAL #2   Title Pt will demo no lumbopelvic instability with 10 reps of dynamic stabilization level 1-4 in order to progress to upright functional deep core strengthening to minimize risks for injuries.   Time 12   Period Weeks   Status Partially Met   PT LONG TERM GOAL #3   Title Pt will demo pelvic floor strength 4/7/7/7 in order to decrease pressure feeling and regain QOL.   Time 12   Period Weeks   Status Partially Met   PT LONG TERM GOAL #4   Title Pt will demo increased diaphragmatic excursion with decreased  midback mm tensions and thoracic kyphosis in order to maintain upright posture for optimal pelvic floor function.   Time 12   Period Weeks   Status Achieved   PT LONG TERM GOAL #5   Title Pt will demo log rolling across two visits without cuing in order to minimize risk for worsening prolapse.   Time 12   Period Weeks   Status Achieved   PT LONG TERM GOAL #6   Title Pt will decrease her score on PSQI from 11/21 pts  (52%) to < 8 /21 pts (38%)  in order to improve sleep. (12/5: 32%)    Time 12   Period Weeks   Status Achieved               Plan - 04/13/15 2109    Clinical Impression Statement Pt has achieved 50% of her goals across the past 5 visits. Pt reports no more stress incontinence and shows improved pelvic floor coordination, strength, and more cephalad position of her bladder. Pt also has achieved  her goal related to improved sleep with a decreased score on PSQI. Pt is progressing well with pelvic floor strengthening with initiation with quick contractions in seated position. Pt demo'd improved pelvic floor coorodination compared to last session. Initiated resistance training for thoracolumbar fascia/ mm  and deep core in bridging position w/ UE movements. Pt showed slight difficulty with scapular retraction and shoulder flexion. Pt will benefit from more neuromuscular training to gradually progress pt back to weight training with minimal risk for injuries to shoulders and pelvic floor. Decreased pt 's frequency to every other week in order to give pt more time to focus on strengthening HEP.    Pt will benefit from skilled therapeutic intervention in order to improve on the following deficits Decreased activity tolerance;Decreased endurance;Decreased range of motion;Decreased strength;Hypomobility;Difficulty walking;Decreased safety awareness;Decreased coordination;Decreased mobility;Hypermobility;Increased muscle spasms;Postural dysfunction;Improper body mechanics;Increased fascial restricitons;Decreased knowledge of precautions;Impaired flexibility   Rehab Potential Good   PT Frequency 1x / week   PT Duration 12 weeks   PT Treatment/Interventions ADLs/Self Care Home Management;Biofeedback;Canalith Repostioning;Cryotherapy;Electrical Stimulation;Gait training;Traction;Ultrasound;Moist Heat;Stair training;Functional mobility training;Therapeutic activities;Therapeutic exercise;Balance training;Neuromuscular re-education;Manual techniques;Orthotic Fit/Training;Patient/family education;Scar mobilization;Passive range of motion;Dry needling;Energy conservation;Taping   Consulted and Agree with Plan of Care Patient        Problem List Patient Active Problem List   Diagnosis Date Noted  . Anxiety 03/03/2015  . Clinical depression 03/03/2015  . Hypercholesterolemia 03/03/2015  . Calculus of kidney  03/03/2015  . Adiposity 03/03/2015  . Acute inflammation of the pancreas 03/03/2015  . Avitaminosis D 03/03/2015  . Pure hypercholesterolemia 07/18/2013    Jerl Mina ,PT, DPT, E-RYT  04/13/2015, 9:12 PM  [place] MAIN [facility] SERVICES [address] [place], [place], [address] Phone: [phone]   Fax:  [phone]  Name: Teresa Moss MRN: [id number] Date of Birth: 01-07-1971

## 2015-04-13 NOTE — Patient Instructions (Addendum)
Handout for theraband on posterior core muscles of back and hip.  10 reps x 3 sets , 2 x day  Green band this week  5 reps x 3 sets, 2 x day, Blue band next week   PELVIC FLOOR / KEGEL EXERCISES   Pelvic floor/ Kegel exercises are used to strengthen the muscles in the base of your pelvis that are responsible for supporting your pelvic organs and preventing urine/feces leakage. Based on your therapist's recommendations, they can be performed while standing, sitting, or lying down. Imagine pelvic floor area as a diamond with pelvic landmarks: top =pubic bone, bottom tip=tailbone, sides=sitting bones (ischial tuberosities).    Make yourself aware of this muscle group by using these cues while coordinating your breath:  Inhale, feel pelvic floor diamond area lower like hammock towards your feet and ribcage/belly expanding. Pause. Let the exhale naturally and feel your belly sink, abdominal muscles hugging in around you and you may notice the pelvic diamond draws upward towards your head forming a umbrella shape. Give a squeeze during the exhalation like you are stopping the flow of urine. If you are squeezing the buttock muscles, try to give 50% less effort.   Common Errors:  Breath holding: If you are holding your breath, you may be bearing down against your bladder instead of pulling it up. If you belly bulges up while you are squeezing, you are holding your breath. Be sure to breathe gently in and out while exercising. Counting out loud may help you avoid holding your breath.  Accessory muscle use: You should not see or feel other muscle movement when performing pelvic floor exercises. When done properly, no one can tell that you are performing the exercises. Keep the buttocks, belly and inner thighs relaxed.  Overdoing it: Your muscles can fatigue and stop working for you if you over-exercise. You may actually leak more or feel soreness at the lower abdomen or rectum.  YOUR HOME EXERCISE  PROGRAM  LONG HOLDS: Position: on back  Inhale and then exhale. Then squeeze the muscle and count aloud for 4 seconds. Rest with three long breaths. (Be sure to let belly sink in with exhales and not push outward)  Perform 10 repetitions, 3 times/day  (4 secs this week)   Perform 5 reps, 3 x day, 5 sec next week)     SHORT HOLDS: Position: on  sitting   Inhale and then exhale. Then squeeze the muscle.  (Be sure to let belly sink in with exhales and not push outward)  Perform 5 repetitions, 5  Times/day  "NO chest lifting with breathing" " exhale, tent up at perineum"  "exhale, sinch like shoelace front L ASIS, R back PSIS of pelvic girdle"                     DECREASE DOWNWARD PRESSURE ON  YOUR PELVIC FLOOR, ABDOMINAL, LOW BACK MUSCLES       PRESERVE YOUR PELVIC HEALTH LONG-TERM   ** SQUEEZE pelvic floor BEFORE YOUR SNEEZE, COUGH, LAUGH   ** EXHALE BEFORE YOU RISE AGAINST GRAVITY (lifting, sit to stand, from squat to stand)   ** LOG ROLL OUT OF BED INSTEAD OF CRUNCH/SIT-UP

## 2015-04-14 LAB — BASIC METABOLIC PANEL
BUN/Creatinine Ratio: 14 (ref 9–23)
BUN: 14 mg/dL (ref 6–24)
CALCIUM: 9.3 mg/dL (ref 8.7–10.2)
CO2: 25 mmol/L (ref 18–29)
CREATININE: 1.02 mg/dL — AB (ref 0.57–1.00)
Chloride: 100 mmol/L (ref 97–106)
GFR calc Af Amer: 77 mL/min/{1.73_m2} (ref >59)
GFR calc non Af Amer: 67 mL/min/{1.73_m2} (ref >59)
Glucose: 91 mg/dL (ref 65–99)
POTASSIUM: 4.6 mmol/L (ref 3.5–5.2)
SODIUM: 140 mmol/L (ref 136–144)

## 2015-04-20 ENCOUNTER — Ambulatory Visit: Payer: 59 | Admitting: Obstetrics and Gynecology

## 2015-04-30 ENCOUNTER — Encounter: Payer: Self-pay | Admitting: Obstetrics and Gynecology

## 2015-04-30 ENCOUNTER — Ambulatory Visit (INDEPENDENT_AMBULATORY_CARE_PROVIDER_SITE_OTHER): Payer: 59 | Admitting: Obstetrics and Gynecology

## 2015-04-30 ENCOUNTER — Encounter: Payer: 59 | Admitting: Physical Therapy

## 2015-04-30 VITALS — BP 128/68 | HR 86 | Resp 16 | Ht 64.0 in | Wt 179.6 lb

## 2015-04-30 DIAGNOSIS — N200 Calculus of kidney: Secondary | ICD-10-CM

## 2015-04-30 MED ORDER — POTASSIUM CITRATE ER 15 MEQ (1620 MG) PO TBCR: 1.0000 | EXTENDED_RELEASE_TABLET | Freq: Every day | ORAL | Status: DC

## 2015-04-30 NOTE — Progress Notes (Signed)
04/30/2015 4:48 PM   Earna Coder [place] [birth year] [id number]  Referring provider: Marinda Elk, MD [place] [facility] [place], [place] [address]  Chief Complaint  Patient presents with  . Nephrolithiasis    HPI: Patient is a 44 year old female with a history of kidney stones presenting today to review her or metabolic panel and repeat urinalysis after starting potassiums citrate at her last visit for prevention of further renal stone formation. She's been taking 1 tablet daily. She is tolerating the medication well. She has not increased her daily water intake as encouraged. She reports that she actually drinks very little water daily.    PMH: Past Medical History  Diagnosis Date  . Kidney stone     Surgical History: Past Surgical History  Procedure Laterality Date  . Cholecystectomy      Home Medications:    Medication List       This list is accurate as of: 04/30/15  4:48 PM.  Always use your most recent med list.               Cholecalciferol 1000 UNITS tablet  Take by mouth.     esomeprazole 20 MG capsule  Commonly known as:  NEXIUM  Take 20 mg by mouth daily at 12 noon.     levonorgestrel 20 MCG/24HR IUD  Commonly known as:  MIRENA  by Intrauterine route.     lovastatin 40 MG tablet  Commonly known as:  MEVACOR  Take by mouth.     Potassium Citrate 15 MEQ (1620 MG) Tbcr  Take 1 tablet by mouth daily.        Allergies:  Allergies  Allergen Reactions  . Penicillins Anaphylaxis  . Codeine Other (See Comments)    Stimulation/paradoxical agitation  . Hydrocodone Other (See Comments)    Paradoxical agitation  . Morphine Other (See Comments)    agitation  . Promethazine Hcl Other (See Comments)    Family History: Family History  Problem Relation Age of Onset  . Heart disease Mother   . Heart disease Father     Social History:  reports that she has been smoking Cigarettes.  She has a 10 pack-year smoking history. She  does not have any smokeless tobacco history on file. She reports that she drinks about 0.6 oz of alcohol per week. She reports that she does not use illicit drugs.  ROS: UROLOGY Frequent Urination?: No Hard to postpone urination?: No Burning/pain with urination?: No Get up at night to urinate?: No Leakage of urine?: No Urine stream starts and stops?: No Trouble starting stream?: No Do you have to strain to urinate?: No Blood in urine?: No Urinary tract infection?: No Sexually transmitted disease?: No Injury to kidneys or bladder?: No Painful intercourse?: No Weak stream?: No Currently pregnant?: No Vaginal bleeding?: No Last menstrual period?: n  Gastrointestinal Nausea?: No Vomiting?: No Indigestion/heartburn?: No Diarrhea?: No Constipation?: No  Constitutional Fever: No Night sweats?: No Weight loss?: No Fatigue?: No  Skin Skin rash/lesions?: No Itching?: No  Eyes Blurred vision?: No Double vision?: No  Ears/Nose/Throat Sore throat?: No Sinus problems?: No  Hematologic/Lymphatic Swollen glands?: No Easy bruising?: No  Cardiovascular Leg swelling?: No Chest pain?: No  Respiratory Cough?: No Shortness of breath?: No  Endocrine Excessive thirst?: No  Musculoskeletal Back pain?: No Joint pain?: No  Neurological Headaches?: No Dizziness?: No  Psychologic Depression?: No Anxiety?: No  Physical Exam: BP 128/68 mmHg  Pulse 86  Resp 16  Ht 5\' 4"  (  1.626 m)  Wt 179 lb 9.6 oz (81.466 kg)  BMI 30.81 kg/m2  Constitutional:  Alert and oriented, No acute distress. HEENT: [place] AT, moist mucus membranes.  Trachea midline, no masses. Cardiovascular: No clubbing, cyanosis, or edema. Respiratory: Normal respiratory effort, no increased work of breathing. Skin: No rashes, bruises or suspicious lesions. Lymph: No cervical or inguinal adenopathy. Neurologic: Grossly intact, no focal deficits, moving all 4 extremities. Psychiatric: Normal mood and  affect.  Laboratory Data:   Urinalysis    Component Value Date/Time   COLORURINE Yellow 09/02/2014 0125   APPEARANCEUR Hazy 09/02/2014 0125   LABSPEC 1.029 09/02/2014 0125   PHURINE 5.0 09/02/2014 0125   GLUCOSEU Negative 04/13/2015 0837   GLUCOSEU Negative 09/02/2014 0125   HGBUR 3+ 09/02/2014 0125   BILIRUBINUR Negative 04/13/2015 0837   BILIRUBINUR Negative 09/02/2014 0125   KETONESUR Negative 09/02/2014 0125   PROTEINUR 30 mg/dL 09/02/2014 0125   NITRITE Negative 04/13/2015 0837   NITRITE Negative 09/02/2014 0125   LEUKOCYTESUR Negative 04/13/2015 0837   LEUKOCYTESUR Negative 09/02/2014 0125    Pertinent Imaging:   Assessment & Plan:    1. Kidney Stones- patient's lab results reviewed. Her urine pH has increased from 5.5-7. This will hopefully decrease her risk of forming more stones. Her potassium has increased slightly but is still well within the normal range. Her creatinine was slightly elevated at 1.02. Patient reports that she has not been drinking sufficient water I encouraged her to significantly increase her fluid intake. We will recheck at her next visit. Prescription for potassium citrate sent to patient's mail order pharmacy.  There are no diagnoses linked to this encounter.  Return for as scheduled in May .  These notes generated with voice recognition software. I apologize for typographical errors.  Herbert Moors, [facility] [address] [place], [place] [address] [phone]

## 2015-05-14 ENCOUNTER — Encounter: Payer: 59 | Admitting: Physical Therapy

## 2015-05-28 ENCOUNTER — Encounter: Payer: 59 | Admitting: Physical Therapy

## 2015-06-03 ENCOUNTER — Other Ambulatory Visit: Payer: Self-pay | Admitting: Physician Assistant

## 2015-06-03 DIAGNOSIS — Z1231 Encounter for screening mammogram for malignant neoplasm of breast: Secondary | ICD-10-CM

## 2015-09-09 ENCOUNTER — Encounter: Payer: Self-pay | Admitting: *Deleted

## 2015-09-11 ENCOUNTER — Ambulatory Visit
Admission: RE | Admit: 2015-09-11 | Discharge: 2015-09-11 | Disposition: A | Discharge status: Home / Self Care | Payer: 59 | Source: Ambulatory Visit | Attending: Urology | Admitting: Urology

## 2015-09-11 ENCOUNTER — Encounter: Payer: Self-pay | Admitting: Urology

## 2015-09-11 ENCOUNTER — Ambulatory Visit (INDEPENDENT_AMBULATORY_CARE_PROVIDER_SITE_OTHER): Payer: 59 | Admitting: Urology

## 2015-09-11 VITALS — BP 117/81 | HR 82 | Ht 64.0 in | Wt 184.4 lb

## 2015-09-11 DIAGNOSIS — N200 Calculus of kidney: Secondary | ICD-10-CM

## 2015-09-11 DIAGNOSIS — Z975 Presence of (intrauterine) contraceptive device: Secondary | ICD-10-CM | POA: Diagnosis not present

## 2015-09-11 DIAGNOSIS — R109 Unspecified abdominal pain: Secondary | ICD-10-CM | POA: Insufficient documentation

## 2015-09-11 DIAGNOSIS — Z87448 Personal history of other diseases of urinary system: Secondary | ICD-10-CM | POA: Diagnosis not present

## 2015-09-11 LAB — URINALYSIS, COMPLETE
Bilirubin, UA: NEGATIVE
Glucose, UA: NEGATIVE
KETONES UA: NEGATIVE
Leukocytes, UA: NEGATIVE
NITRITE UA: NEGATIVE
Protein, UA: NEGATIVE
Specific Gravity, UA: 1.025 (ref 1.005–1.030)
UUROB: 0.2 mg/dL (ref 0.2–1.0)
pH, UA: 5.5 (ref 5.0–7.5)

## 2015-09-11 LAB — MICROSCOPIC EXAMINATION
Epithelial Cells (non renal): >10 /hpf — AB (ref 0–10)
WBC UA: NONE SEEN /HPF (ref 0–?)

## 2015-09-11 NOTE — Progress Notes (Signed)
09/11/2015 10:47 AM   Teresa Moss [birth year] [id number]  Referring provider: Marinda Elk, MD [facility] [place], [place] [address]  Chief Complaint  Patient presents with  . Nephrolithiasis    1 year follow up    HPI: Patient is a 45 year old Caucasian female with a history of nephrolithiasis who presents today for a yearly follow up.  Patient underwent a right ESWL on 09/04/2014 for a distal right ureteral stone.  She did have AMH associated with the stone.  She stated the post operative course was very painful.    Over the last year, she has not had flank pain or gross hematuria.  She does suffer with urinary frequency, urgency, nocturia and mixed urinary incontinence. Her PCP placed her on Detrol LA and she has noted a 75% improvement in her urinary symptoms.  Her UA today is unremarkable. KUB taken today demonstrates a stable left renal pole stone.  I have personally reviewed the films.  She has not had any fevers, chills, nausea or vomiting.     PMH: Past Medical History  Diagnosis Date  . Kidney stone     Right  . HLD (hyperlipidemia)   . GERD (gastroesophageal reflux disease)   . Abnormal vaginal bleeding     Surgical History: Past Surgical History  Procedure Laterality Date  . Cholecystectomy  2012    Home Medications:    Medication List       This list is accurate as of: 09/11/15 10:47 AM.  Always use your most recent med list.               Cholecalciferol 1000 units tablet  Take by mouth.     esomeprazole 40 MG capsule  Commonly known as:  NEXIUM     levonorgestrel 20 MCG/24HR IUD  Commonly known as:  MIRENA  by Intrauterine route.     lovastatin 40 MG tablet  Commonly known as:  MEVACOR  Take by mouth.     Potassium Citrate 15 MEQ (1620 MG) Tbcr  Take 1 tablet by mouth daily.     tolterodine 2 MG 24 hr capsule  Commonly known as:  DETROL LA  Take by mouth.        Allergies:  Allergies    Allergen Reactions  . Penicillins Anaphylaxis  . Codeine Other (See Comments)    Stimulation/paradoxical agitation  . Hydrocodone Other (See Comments)    Paradoxical agitation  . Morphine Other (See Comments)    agitation  . Promethazine Hcl Other (See Comments)    Family History: Family History  Problem Relation Age of Onset  . Heart disease Mother   . Heart disease Father   . Kidney disease Neg Hx   . Bladder Cancer Neg Hx     Social History:  reports that she has been smoking Cigarettes.  She has a 10 pack-year smoking history. She does not have any smokeless tobacco history on file. She reports that she drinks about 0.6 oz of alcohol per week. She reports that she does not use illicit drugs.  ROS: UROLOGY Frequent Urination?: Yes Hard to postpone urination?: Yes Burning/pain with urination?: No Get up at night to urinate?: Yes Leakage of urine?: Yes Urine stream starts and stops?: No Trouble starting stream?: No Do you have to strain to urinate?: No Blood in urine?: No Urinary tract infection?: No Sexually transmitted disease?: No Injury to kidneys or bladder?: No Painful intercourse?: No Weak stream?: No Currently  pregnant?: No Vaginal bleeding?: No Last menstrual period?: n  Gastrointestinal Nausea?: No Vomiting?: No Indigestion/heartburn?: Yes Diarrhea?: No Constipation?: No  Constitutional Fever: No Night sweats?: No Weight loss?: No Fatigue?: No  Skin Skin rash/lesions?: No Itching?: No  Eyes Blurred vision?: No Double vision?: No  Ears/Nose/Throat Sore throat?: No Sinus problems?: Yes  Hematologic/Lymphatic Swollen glands?: No Easy bruising?: No  Cardiovascular Leg swelling?: No Chest pain?: No  Respiratory Cough?: No Shortness of breath?: No  Endocrine Excessive thirst?: No  Musculoskeletal Back pain?: No Joint pain?: No  Neurological Headaches?: No Dizziness?: No  Psychologic Depression?: No Anxiety?:  No  Physical Exam: BP 117/81 mmHg  Pulse 82  Ht 5\' 4"  (1.626 m)  Wt 184 lb 6.4 oz (83.643 kg)  BMI 31.64 kg/m2  Constitutional: Well nourished. Alert and oriented, No acute distress. HEENT: [place] AT, moist mucus membranes. Trachea midline, no masses. Cardiovascular: No clubbing, cyanosis, or edema. Respiratory: Normal respiratory effort, no increased work of breathing. GI: Abdomen is soft, non tender, non distended, no abdominal masses.  Skin: No rashes, bruises or suspicious lesions. Lymph: No cervical or inguinal adenopathy. Neurologic: Grossly intact, no focal deficits, moving all 4 extremities. Psychiatric: Normal mood and affect.  Laboratory Data: Lab Results  Component Value Date   WBC 11.1* 09/02/2014   HGB 15.5 09/02/2014   HCT 46.5 09/02/2014   MCV 90 09/02/2014   PLT 297 09/02/2014    Lab Results  Component Value Date   CREATININE 1.02* 04/13/2015    Lab Results  Component Value Date   AST 16 12/11/2013   Lab Results  Component Value Date   ALT 35 12/11/2013      Urinalysis Results for orders placed or performed in visit on 09/11/15  Microscopic Examination  Result Value Ref Range   WBC, UA None seen 0 -  5 /hpf   RBC, UA 0-2 0 -  2 /hpf   Epithelial Cells (non renal) >10 (A) 0 - 10 /hpf   Bacteria, UA Few None seen/Few  Urinalysis, Complete  Result Value Ref Range   Specific Gravity, UA 1.025 1.005 - 1.030   pH, UA 5.5 5.0 - 7.5   Color, UA Yellow Yellow   Appearance Ur Hazy (A) Clear   Leukocytes, UA Negative Negative   Protein, UA Negative Negative/Trace   Glucose, UA Negative Negative   Ketones, UA Negative Negative   RBC, UA Trace (A) Negative   Bilirubin, UA Negative Negative   Urobilinogen, Ur 0.2 0.2 - 1.0 mg/dL   Nitrite, UA Negative Negative   Microscopic Examination See below:      Pertinent Imaging: CLINICAL DATA: Nephrolithiasis.  EXAM: ABDOMEN - 1 VIEW  COMPARISON: CT 02/27/2015. KUB 09/03/2014.  FINDINGS: Soft  tissue structures are unremarkable. Tiny sandlike stone noted over the left kidney. Stable punctate calcifications in the pelvis most likely phleboliths. No bowel distention. No free air. IUD noted in the pelvis. Lumbar spine scoliosis concave left .  IMPRESSION: Tiny punctate stone left kidney. IUD noted the pelvis. No acute abnormality.   Electronically Signed By: Marcello Moores Register On: 09/11/2015 11:25  Assessment & Plan:    1. Kidney stones:   Patient has not had flank pain or gross hematuria over the last year. She has a stable left lower pole stone. She will RTC in one year for KUB.  She will contact the office if she should experience flank pain or gross hematuria.  - Urinalysis, Complete  2. History of hematuria:   Patient has  not had any episodes of gross hematuria over the last year.   Her UA today was unremarkable.  She will RTC in one year for UA.  She will contact the office if she experiences any gross hematuria.     Return in about 1 year (around 09/10/2016) for UA and KUB.  These notes generated with voice recognition software. I apologize for typographical errors.  Zara Council, [facility] [address] [place], [place] [address] [phone]

## 2015-09-17 DIAGNOSIS — N200 Calculus of kidney: Secondary | ICD-10-CM | POA: Insufficient documentation

## 2015-09-17 DIAGNOSIS — Z87448 Personal history of other diseases of urinary system: Secondary | ICD-10-CM | POA: Insufficient documentation

## 2015-11-18 ENCOUNTER — Ambulatory Visit
Admission: RE | Admit: 2015-11-18 | Discharge: 2015-11-18 | Disposition: A | Discharge status: Home / Self Care | Payer: 59 | Source: Ambulatory Visit | Attending: Physician Assistant | Admitting: Physician Assistant

## 2015-11-18 ENCOUNTER — Other Ambulatory Visit: Payer: Self-pay | Admitting: Physician Assistant

## 2015-11-18 DIAGNOSIS — Z1231 Encounter for screening mammogram for malignant neoplasm of breast: Secondary | ICD-10-CM

## 2016-03-28 ENCOUNTER — Other Ambulatory Visit: Payer: Self-pay

## 2016-03-28 DIAGNOSIS — N200 Calculus of kidney: Secondary | ICD-10-CM

## 2016-03-28 MED ORDER — POTASSIUM CITRATE ER 15 MEQ (1620 MG) PO TBCR: 1.0000 | EXTENDED_RELEASE_TABLET | Freq: Every day | ORAL | 3 refills | Status: DC

## 2016-05-18 ENCOUNTER — Ambulatory Visit: Payer: 59 | Admitting: Urology

## 2016-05-23 ENCOUNTER — Encounter: Payer: Self-pay | Admitting: Urology

## 2016-05-23 ENCOUNTER — Ambulatory Visit: Payer: 59 | Admitting: Urology

## 2016-05-23 VITALS — BP 122/79 | HR 79 | Ht 64.0 in | Wt 186.8 lb

## 2016-05-23 DIAGNOSIS — R350 Frequency of micturition: Secondary | ICD-10-CM

## 2016-05-23 DIAGNOSIS — N200 Calculus of kidney: Secondary | ICD-10-CM | POA: Diagnosis not present

## 2016-05-23 LAB — URINALYSIS, COMPLETE
Bilirubin, UA: NEGATIVE
Glucose, UA: NEGATIVE
Ketones, UA: NEGATIVE
LEUKOCYTES UA: NEGATIVE
NITRITE UA: NEGATIVE
PH UA: 6 (ref 5.0–7.5)
Protein, UA: NEGATIVE
RBC UA: NEGATIVE
SCAN RESULT: 0
Specific Gravity, UA: 1.02 (ref 1.005–1.030)
Urobilinogen, Ur: 0.2 mg/dL (ref 0.2–1.0)

## 2016-05-23 LAB — MICROSCOPIC EXAMINATION: RBC MICROSCOPIC, UA: NONE SEEN /HPF (ref 0–?)

## 2016-05-23 NOTE — Progress Notes (Signed)
05/23/2016 11:28 AM   Teresa Moss 02/21/1971 PO:[id number]  Referring provider: Marinda Elk, MD [place] [facility] [place], [place] [address]  Chief Complaint  Patient presents with  . Urinary Frequency    last seen 05/17    HPI: Patient is a 46 year old Caucasian female with a history of nephrolithiasis and urinary frequency who presents today for evaluation for urinary frequency.  History of nephrolithiasis Patient underwent a right ESWL on 09/04/2014 for a distal right ureteral stone.  KUB taken on 09/11/2015 demonstrates a stable left renal pole stone.  24-hour urine performed at that time noted a markedly decreased in urine volume, hypomagnesemia, marked hypocitruria borderline low urine pH and mild uric acid super saturation.  Her urine pH today is 6.0.  She is taking Urocit-K, one tablet daily.    Urinary frequency She does suffer with urinary frequency, urgency, nocturia and mixed urinary incontinence. Her PCP placed her on Detrol LA 2 mg daily.  It is no longer effective.  She is rushing to use the restroom three times daily, she is urinating seven times daily, she is limiting her fluids three days a week in an effort to stave off trips to the restroom, she engages in toilet mapping, she is experiencing leakage of urine seven times weekly and uses the restroom up to three times nightly.  She is mostly irritated with leakage of urine with straining and activities.  She denies gross hematuria, suprapubic pain and dysuria.  Her PVR is 0 mL.  Her UA today is unremarkable.  She has not had any fevers, chills, nausea or vomiting.     PMH: Past Medical History:  Diagnosis Date  . Abnormal vaginal bleeding   . GERD (gastroesophageal reflux disease)   . HLD (hyperlipidemia)   . Kidney stone    Right    Surgical History: Past Surgical History:  Procedure Laterality Date  . CHOLECYSTECTOMY  2012    Home Medications:  Allergies as of 05/23/2016    Reactions   Penicillins Anaphylaxis   Codeine Other (See Comments)   Stimulation/paradoxical agitation   Hydrocodone Other (See Comments)   Paradoxical agitation   Morphine Other (See Comments)   agitation   Promethazine Hcl Other (See Comments)      Medication List       Accurate as of 05/23/16 11:28 AM. Always use your most recent med list.          buPROPion 150 MG 24 hr tablet Commonly known as:  WELLBUTRIN XL Take by mouth.   Cholecalciferol 1000 units tablet Take by mouth.   esomeprazole 40 MG capsule Commonly known as:  NEXIUM   levonorgestrel 20 MCG/24HR IUD Commonly known as:  MIRENA by Intrauterine route.   lovastatin 40 MG tablet Commonly known as:  MEVACOR Take by mouth.   Potassium Citrate 15 MEQ (1620 MG) Tbcr Take 1 tablet by mouth daily.   tolterodine 2 MG 24 hr capsule Commonly known as:  DETROL LA TAKE 1 CAPSULE BY MOUTH  ONCE DAILY   triamcinolone cream 0.1 % Commonly known as:  KENALOG Apply topically.       Allergies:  Allergies  Allergen Reactions  . Penicillins Anaphylaxis  . Codeine Other (See Comments)    Stimulation/paradoxical agitation  . Hydrocodone Other (See Comments)    Paradoxical agitation  . Morphine Other (See Comments)    agitation  . Promethazine Hcl Other (See Comments)    Family History: Family History  Problem  Relation Age of Onset  . Heart disease Mother   . Heart disease Father   . Kidney disease Neg Hx   . Bladder Cancer Neg Hx     Social History:  reports that she has been smoking Cigarettes.  She has a 10.00 pack-year smoking history. She has never used smokeless tobacco. She reports that she drinks about 0.6 oz of alcohol per week . She reports that she does not use drugs.  ROS: UROLOGY Frequent Urination?: Yes Hard to postpone urination?: Yes Burning/pain with urination?: No Get up at night to urinate?: Yes Leakage of urine?: Yes Urine stream starts and stops?: No Trouble starting  stream?: No Do you have to strain to urinate?: No Blood in urine?: No Urinary tract infection?: No Sexually transmitted disease?: No Injury to kidneys or bladder?: No Painful intercourse?: No Weak stream?: No Currently pregnant?: No Vaginal bleeding?: No Last menstrual period?: n  Gastrointestinal Nausea?: No Vomiting?: No Indigestion/heartburn?: No Diarrhea?: No Constipation?: No  Constitutional Fever: No Night sweats?: No Weight loss?: No Fatigue?: No  Skin Skin rash/lesions?: No Itching?: No  Eyes Blurred vision?: No Double vision?: No  Ears/Nose/Throat Sore throat?: No Sinus problems?: No  Hematologic/Lymphatic Swollen glands?: No Easy bruising?: No  Cardiovascular Leg swelling?: No Chest pain?: No  Respiratory Cough?: No Shortness of breath?: No  Endocrine Excessive thirst?: No  Musculoskeletal Back pain?: No Joint pain?: No  Neurological Headaches?: No Dizziness?: No  Psychologic Depression?: No Anxiety?: No  Physical Exam: BP 122/79   Pulse 79   Ht 5\' 4"  (1.626 m)   Wt 186 lb 12.8 oz (84.7 kg)   BMI 32.06 kg/m   Constitutional: Well nourished. Alert and oriented, No acute distress. HEENT: [place] AT, moist mucus membranes. Trachea midline, no masses. Cardiovascular: No clubbing, cyanosis, or edema. Respiratory: Normal respiratory effort, no increased work of breathing. GI: Abdomen is soft, non tender, non distended, no abdominal masses. Liver and spleen not palpable.  No hernias appreciated.  Stool sample for occult testing is not indicated.   GU: No CVA tenderness.  No bladder fullness or masses.  Normal external genitalia, normal pubic hair distribution, no lesions.  Normal urethral meatus, no lesions, no prolapse, no discharge.   No urethral masses, tenderness and/or tenderness. No bladder fullness, tenderness or masses. Normal vagina mucosa, good estrogen effect, no discharge, no lesions, good pelvic support, Grade II cystocele is  noted.  No rectocele noted.  No cervical motion tenderness.  Uterus is freely mobile and non-fixed.  No adnexal/parametria masses or tenderness noted.  Anus and perineum are without rashes or lesions.    Skin: No rashes, bruises or suspicious lesions. Lymph: No cervical or inguinal adenopathy. Neurologic: Grossly intact, no focal deficits, moving all 4 extremities. Psychiatric: Normal mood and affect.  Laboratory Data: Lab Results  Component Value Date   WBC 11.1 (H) 09/02/2014   HGB 15.5 09/02/2014   HCT 46.5 09/02/2014   MCV 90 09/02/2014   PLT 297 09/02/2014    Lab Results  Component Value Date   CREATININE 1.02 (H) 04/13/2015    Lab Results  Component Value Date   AST 16 12/11/2013   Lab Results  Component Value Date   ALT 35 12/11/2013    Urinalysis Unremarkable.  See EPIC.    Pertinent Imaging: Results for SHAHIDAH, HANIF (MRN [id number]) as of 05/23/2016 11:14  Ref. Range 05/23/2016 10:56  Scan Result Unknown 0   Assessment & Plan:    1. SUI  - failed  anticholinergics and PT  - patient to schedule an appointment with Dr. Matilde Sprang for further evaluation  1. Frequency  - Urinalysis, Complete  - failed anticholinergics and PT  - not interested in trying Myrbetriq    2. Cystocele  - failed anticholinergics and PT  - patient to schedule an appointment with Dr. Matilde Sprang for further evaluation  3. Kidney stones  - Patient has not had flank pain or gross hematuria over the last year.   - She has a stable left lower pole stone. She will RTC in one year for KUB.    - She will contact the office if she should experience flank pain or gross hematuria.  - taking one Urocit-K daily  - CMP and CBC  - RTC in one year  Return for appointment with Dr. Matilde Sprang.  These notes generated with voice recognition software. I apologize for typographical errors.  Zara Council, [facility] [address], [place], [place]  [address] [phone]

## 2016-05-24 ENCOUNTER — Telehealth: Payer: Self-pay

## 2016-05-24 LAB — CBC WITH DIFFERENTIAL/PLATELET
BASOS ABS: 0 10*3/uL (ref 0.0–0.2)
Basos: 0 %
EOS (ABSOLUTE): 0.1 10*3/uL (ref 0.0–0.4)
EOS: 1 %
Hematocrit: 40.8 % (ref 34.0–46.6)
Hemoglobin: 13.5 g/dL (ref 11.1–15.9)
IMMATURE GRANULOCYTES: 0 %
Immature Grans (Abs): 0 10*3/uL (ref 0.0–0.1)
LYMPHS ABS: 2.8 10*3/uL (ref 0.7–3.1)
Lymphs: 28 %
MCH: 29.4 pg (ref 26.6–33.0)
MCHC: 33.1 g/dL (ref 31.5–35.7)
MCV: 89 fL (ref 79–97)
MONOS ABS: 0.4 10*3/uL (ref 0.1–0.9)
Monocytes: 4 %
NEUTROS PCT: 67 %
Neutrophils Absolute: 6.6 10*3/uL (ref 1.4–7.0)
PLATELETS: 285 10*3/uL (ref 150–379)
RBC: 4.59 x10E6/uL (ref 3.77–5.28)
RDW: 13.8 % (ref 12.3–15.4)
WBC: 9.9 10*3/uL (ref 3.4–10.8)

## 2016-05-24 LAB — BASIC METABOLIC PANEL
BUN/Creatinine Ratio: 13 (ref 9–23)
BUN: 11 mg/dL (ref 6–24)
CHLORIDE: 101 mmol/L (ref 96–106)
CO2: 24 mmol/L (ref 18–29)
CREATININE: 0.85 mg/dL (ref 0.57–1.00)
Calcium: 9 mg/dL (ref 8.7–10.2)
GFR calc Af Amer: 96 mL/min/{1.73_m2} (ref >59)
GFR calc non Af Amer: 83 mL/min/{1.73_m2} (ref >59)
GLUCOSE: 98 mg/dL (ref 65–99)
POTASSIUM: 4.5 mmol/L (ref 3.5–5.2)
Sodium: 140 mmol/L (ref 134–144)

## 2016-05-24 NOTE — Telephone Encounter (Signed)
Spoke with pt in reference to lab results and medication. Pt voiced understanding.

## 2016-05-24 NOTE — Telephone Encounter (Signed)
-----   Message from [name], PA-C sent at 05/24/2016  7:35 AM EST ----- Labs look good.  Continue Urocit-K.

## 2016-06-07 DIAGNOSIS — D229 Melanocytic nevi, unspecified: Secondary | ICD-10-CM | POA: Diagnosis not present

## 2016-06-07 DIAGNOSIS — E7800 Pure hypercholesterolemia, unspecified: Secondary | ICD-10-CM | POA: Diagnosis not present

## 2016-06-07 DIAGNOSIS — E559 Vitamin D deficiency, unspecified: Secondary | ICD-10-CM | POA: Diagnosis not present

## 2016-06-07 DIAGNOSIS — C4490 Unspecified malignant neoplasm of skin, unspecified: Secondary | ICD-10-CM | POA: Diagnosis not present

## 2016-06-07 DIAGNOSIS — Z0000 Encounter for general adult medical examination without abnormal findings: Secondary | ICD-10-CM | POA: Diagnosis not present

## 2016-06-08 ENCOUNTER — Encounter: Payer: Self-pay | Admitting: Urology

## 2016-06-08 ENCOUNTER — Ambulatory Visit (INDEPENDENT_AMBULATORY_CARE_PROVIDER_SITE_OTHER): Payer: 59 | Admitting: Urology

## 2016-06-08 VITALS — BP 140/77 | HR 87 | Ht 64.0 in | Wt 186.9 lb

## 2016-06-08 DIAGNOSIS — N3946 Mixed incontinence: Secondary | ICD-10-CM | POA: Diagnosis not present

## 2016-06-08 MED ORDER — MIRABEGRON ER 25 MG PO TB24: 25.0000 mg | ORAL_TABLET | Freq: Every day | ORAL | 3 refills | Status: DC

## 2016-06-08 NOTE — Progress Notes (Signed)
06/08/2016 8:53 AM   Teresa Moss [birth year] [id number]  Referring provider: Marinda Elk, MD [facility] [place], [place] [address]  Chief Complaint  Patient presents with  . Urinary Frequency    cystocele     HPI: I was consulted to assess the patient's urinary frequency and urinary incontinence worsening over the last 2 years. She can void twice in an hour due to pressure but not pain. She gets up at least twice at night to urinate. She leaks with coughing sneezing bending and lifting. Sometimes she has urgency incontinence. She does not have enuresis. She does not wear pads. She has failed Detrol.  She denies a history o of previous GU surgery and urinary tract infections. She is followed by [facility] for kidney stones. She has no neurologic issues.  Modifying factors: There are no other modifying factors  Associated signs and symptoms: There are no other associated signs and symptoms Aggravating and relieving factors: There are no other aggravating or relieving factors Severity: Moderate Duration: Persistent  PMH: Past Medical History:  Diagnosis Date  . Abnormal vaginal bleeding   . GERD (gastroesophageal reflux disease)   . HLD (hyperlipidemia)   . Kidney stone    Right    Surgical History: Past Surgical History:  Procedure Laterality Date  . CHOLECYSTECTOMY  2012    Home Medications:  Allergies as of 06/08/2016      Reactions   Penicillins Anaphylaxis   Codeine Other (See Comments)   Stimulation/paradoxical agitation   Hydrocodone Other (See Comments)   Paradoxical agitation   Morphine Other (See Comments)   agitation   Promethazine Hcl Other (See Comments)      Medication List       Accurate as of 06/08/16  8:53 AM. Always use your most recent med list.          buPROPion 150 MG 24 hr tablet Commonly known as:  WELLBUTRIN XL Take by mouth.   Cholecalciferol 1000 units tablet Take by mouth.   esomeprazole 40  MG capsule Commonly known as:  NEXIUM   levonorgestrel 20 MCG/24HR IUD Commonly known as:  MIRENA by Intrauterine route.   lovastatin 40 MG tablet Commonly known as:  MEVACOR Take by mouth.   Potassium Citrate 15 MEQ (1620 MG) Tbcr Take 1 tablet by mouth daily.   tolterodine 2 MG 24 hr capsule Commonly known as:  DETROL LA TAKE 1 CAPSULE BY MOUTH  ONCE DAILY       Allergies:  Allergies  Allergen Reactions  . Penicillins Anaphylaxis  . Codeine Other (See Comments)    Stimulation/paradoxical agitation  . Hydrocodone Other (See Comments)    Paradoxical agitation  . Morphine Other (See Comments)    agitation  . Promethazine Hcl Other (See Comments)    Family History: Family History  Problem Relation Age of Onset  . Heart disease Mother   . Heart disease Father   . Kidney disease Neg Hx   . Bladder Cancer Neg Hx     Social History:  reports that she has been smoking Cigarettes.  She has a 10.00 pack-year smoking history. She has never used smokeless tobacco. She reports that she drinks about 0.6 oz of alcohol per week . She reports that she does not use drugs.  ROS: UROLOGY Frequent Urination?: Yes Hard to postpone urination?: Yes Burning/pain with urination?: No Get up at night to urinate?: Yes Leakage of urine?: Yes Urine stream starts and stops?: No Trouble  starting stream?: No Do you have to strain to urinate?: No Blood in urine?: No Urinary tract infection?: No Sexually transmitted disease?: No Injury to kidneys or bladder?: No Painful intercourse?: No Weak stream?: No Currently pregnant?: No Vaginal bleeding?: No Last menstrual period?: n  Gastrointestinal Nausea?: No Vomiting?: No Indigestion/heartburn?: No Diarrhea?: No Constipation?: No  Constitutional Fever: No Night sweats?: No Fatigue?: No  Skin Skin rash/lesions?: No Itching?: No  Eyes Blurred vision?: No Double vision?: No  Ears/Nose/Throat Sore throat?: No Sinus  problems?: No  Hematologic/Lymphatic Swollen glands?: No Easy bruising?: No  Cardiovascular Leg swelling?: No Chest pain?: No  Respiratory Cough?: No Shortness of breath?: No  Endocrine Excessive thirst?: No  Musculoskeletal Back pain?: No Joint pain?: No  Neurological Headaches?: No Dizziness?: No  Psychologic Depression?: No Anxiety?: No  Physical Exam: BP 140/77   Pulse 87   Ht 5\' 4"  (1.626 m)   Wt 186 lb 14.4 oz (84.8 kg)   BMI 32.08 kg/m   Constitutional:  Alert and oriented, No acute distress. HEENT: [place] AT, moist mucus membranes.  Trachea midline, no masses. Cardiovascular: No clubbing, cyanosis, or edema. Respiratory: Normal respiratory effort, no increased work of breathing. GI: Abdomen is soft, nontender, nondistended, no abdominal masses GU: . Grade 2 hypermobility of the bladder neck and no stress incontinence. No pelvic tenderness and no prolapse Skin: No rashes, bruises or suspicious lesions. Lymph: No cervical or inguinal adenopathy. Neurologic: Grossly intact, no focal deficits, moving all 4 extremities. Psychiatric: Normal mood and affect.  Laboratory Data: Lab Results  Component Value Date   WBC 9.9 05/23/2016   HGB 15.5 09/02/2014   HCT 40.8 05/23/2016   MCV 89 05/23/2016   PLT 285 05/23/2016    Lab Results  Component Value Date   CREATININE 0.85 05/23/2016    No results found for: PSA  No results found for: TESTOSTERONE  No results found for: HGBA1C  Urinalysis    Component Value Date/Time   COLORURINE Yellow 09/02/2014 0125   APPEARANCEUR Cloudy (A) 05/23/2016 1056   LABSPEC 1.029 09/02/2014 0125   PHURINE 5.0 09/02/2014 0125   GLUCOSEU Negative 05/23/2016 1056   GLUCOSEU Negative 09/02/2014 0125   HGBUR 3+ 09/02/2014 0125   BILIRUBINUR Negative 05/23/2016 1056   BILIRUBINUR Negative 09/02/2014 0125   KETONESUR Negative 09/02/2014 0125   PROTEINUR Negative 05/23/2016 1056   PROTEINUR 30 mg/dL 09/02/2014 0125    NITRITE Negative 05/23/2016 1056   NITRITE Negative 09/02/2014 0125   LEUKOCYTESUR Negative 05/23/2016 1056   LEUKOCYTESUR Negative 09/02/2014 0125    Pertinent Imaging: none  Assessment & Plan:  The patient has mild mixed incontinence and significant urinary frequency. She has failed cold guarding. She has milder nocturia. My index of suspicion is low that she has interstitial cystitis. If we did urodynamics it may shed some light on this in the differential diagnosis.  The pathophysiology of incontinence was discussed. The role of physical therapy and medication was discussed. I thought it was very reasonable to try the beta 3 agonist 25 mg samples and prescription. If this failed we would order urodynamics. I mentioned the possibility of interstitial cystitis. She does not have painful intercourse. She failed physical therapy last year.     There are no diagnoses linked to this encounter.  No Follow-up on file.  Reece Packer, MD  [facility] [address] [place], [place] [address] [phone]

## 2016-07-06 ENCOUNTER — Ambulatory Visit (INDEPENDENT_AMBULATORY_CARE_PROVIDER_SITE_OTHER): Payer: 59 | Admitting: Urology

## 2016-07-06 ENCOUNTER — Encounter: Payer: Self-pay | Admitting: Urology

## 2016-07-06 VITALS — BP 125/79 | HR 87 | Ht 67.0 in | Wt 186.4 lb

## 2016-07-06 DIAGNOSIS — N8111 Cystocele, midline: Secondary | ICD-10-CM | POA: Diagnosis not present

## 2016-07-06 DIAGNOSIS — R350 Frequency of micturition: Secondary | ICD-10-CM | POA: Diagnosis not present

## 2016-07-06 DIAGNOSIS — N3946 Mixed incontinence: Secondary | ICD-10-CM

## 2016-07-06 DIAGNOSIS — N200 Calculus of kidney: Secondary | ICD-10-CM

## 2016-07-06 LAB — BLADDER SCAN AMB NON-IMAGING: Scan Result: 0

## 2016-07-06 NOTE — Progress Notes (Signed)
07/06/2016 10:55 AM   Teresa Moss 07/29/1970 [id number]  Referring provider: Marinda Elk, MD [facility] [place], [place] [address]  Chief Complaint  Patient presents with  . Follow-up     4 weeks mixed incontinence    HPI: Patient is a 46 year old Caucasian female with a history of nephrolithiasis and urinary frequency who presents today for evaluation for urinary frequency.  History of nephrolithiasis Patient underwent a right ESWL on 09/04/2014 for a distal right ureteral stone.  KUB taken on 09/11/2015 demonstrates a stable left renal pole stone.  24-hour urine performed at that time noted a markedly decreased in urine volume, hypomagnesemia, marked hypocitruria borderline low urine pH and mild uric acid super saturation.  Her urine pH today is 6.0.  She is taking Urocit-K, one tablet daily.    Urinary frequency She does suffer with urinary frequency, urgency, nocturia and mixed urinary incontinence. Her PCP placed her on Detrol LA 2 mg daily.  It is no longer effective.  She is rushing to use the restroom three times daily, she is urinating seven times daily, she is limiting her fluids three days a week in an effort to stave off trips to the restroom, she engages in toilet mapping, she is experiencing leakage of urine seven times weekly and uses the restroom up to three times nightly.  She is mostly irritated with leakage of urine with straining and activities.  She denies gross hematuria, suprapubic pain and dysuria.  Her PVR is 0 mL.  Her UA today is unremarkable.  She has not had any fevers, chills, nausea or vomiting.    Patient was given a trial of Myrbetriq at her last visit with Dr. Matilde Sprang.  She was only able to tolerate the medication for one week before she had to stop the medication due to diarrhea and stomach upset.  She did find the medication helpful for her urinary symptoms.    She is now experiencing frequency x 4-7, urgency x  4-7, limits fluid intake to avoid accidents, engaging in toilet mapping, incontinence episodes x 0-3 and nocturia x 0-3.  She denies gross hematuria, dysuria and suprapubic pain.  She is not having flank pain, fevers, chills, nausea or vomiting.     PMH: Past Medical History:  Diagnosis Date  . Abnormal vaginal bleeding   . GERD (gastroesophageal reflux disease)   . HLD (hyperlipidemia)   . Kidney stone    Right    Surgical History: Past Surgical History:  Procedure Laterality Date  . CHOLECYSTECTOMY  2012    Home Medications:  Allergies as of 07/06/2016      Reactions   Penicillins Anaphylaxis   Codeine Other (See Comments)   Stimulation/paradoxical agitation   Hydrocodone Other (See Comments)   Paradoxical agitation   Morphine Other (See Comments)   agitation   Promethazine Hcl Other (See Comments)      Medication List       Accurate as of 07/06/16 11:59 PM. Always use your most recent med list.          buPROPion 150 MG 24 hr tablet Commonly known as:  WELLBUTRIN XL Take by mouth.   Cholecalciferol 1000 units tablet Take by mouth.   esomeprazole 40 MG capsule Commonly known as:  NEXIUM   levonorgestrel 20 MCG/24HR IUD Commonly known as:  MIRENA by Intrauterine route.   lovastatin 40 MG tablet Commonly known as:  MEVACOR Take by mouth.   mirabegron ER 25  MG Tb24 tablet Commonly known as:  MYRBETRIQ Take 1 tablet (25 mg total) by mouth daily.   Potassium Citrate 15 MEQ (1620 MG) Tbcr Take 1 tablet by mouth daily.   tolterodine 2 MG 24 hr capsule Commonly known as:  DETROL LA TAKE 1 CAPSULE BY MOUTH  ONCE DAILY   triamcinolone cream 0.1 % Commonly known as:  KENALOG Apply topically.       Allergies:  Allergies  Allergen Reactions  . Penicillins Anaphylaxis  . Codeine Other (See Comments)    Stimulation/paradoxical agitation  . Hydrocodone Other (See Comments)    Paradoxical agitation  . Morphine Other (See Comments)    agitation  .  Promethazine Hcl Other (See Comments)    Family History: Family History  Problem Relation Age of Onset  . Heart disease Mother   . Heart disease Father   . Kidney disease Neg Hx   . Bladder Cancer Neg Hx     Social History:  reports that she has been smoking Cigarettes.  She has a 10.00 pack-year smoking history. She has never used smokeless tobacco. She reports that she drinks about 0.6 oz of alcohol per week . She reports that she does not use drugs.  ROS: UROLOGY Frequent Urination?: Yes Hard to postpone urination?: Yes Burning/pain with urination?: No Get up at night to urinate?: Yes Leakage of urine?: Yes Urine stream starts and stops?: No Trouble starting stream?: No Do you have to strain to urinate?: No Blood in urine?: No Urinary tract infection?: No Sexually transmitted disease?: No Injury to kidneys or bladder?: No Painful intercourse?: No Weak stream?: No Currently pregnant?: No Vaginal bleeding?: No Last menstrual period?: n  Gastrointestinal Nausea?: No Vomiting?: No Indigestion/heartburn?: Yes Diarrhea?: No Constipation?: No  Constitutional Fever: No Night sweats?: No Weight loss?: No Fatigue?: No  Skin Skin rash/lesions?: No Itching?: No  Eyes Blurred vision?: No Double vision?: No  Ears/Nose/Throat Sore throat?: No Sinus problems?: No  Hematologic/Lymphatic Swollen glands?: No Easy bruising?: No  Cardiovascular Leg swelling?: No Chest pain?: No  Respiratory Cough?: No Shortness of breath?: No  Endocrine Excessive thirst?: No  Musculoskeletal Back pain?: No Joint pain?: No  Neurological Headaches?: No Dizziness?: No  Psychologic Depression?: No Anxiety?: No  Physical Exam: BP 125/79   Pulse 87   Ht 5\' 7"  (1.702 m)   Wt 186 lb 6.4 oz (84.6 kg)   BMI 29.19 kg/m   Constitutional: Well nourished. Alert and oriented, No acute distress. HEENT: [place] AT, moist mucus membranes. Trachea midline, no  masses. Cardiovascular: No clubbing, cyanosis, or edema. Respiratory: Normal respiratory effort, no increased work of breathing. Skin: No rashes, bruises or suspicious lesions. Lymph: No cervical or inguinal adenopathy. Neurologic: Grossly intact, no focal deficits, moving all 4 extremities. Psychiatric: Normal mood and affect.  Laboratory Data: Lab Results  Component Value Date   WBC 9.9 05/23/2016   HGB 15.5 09/02/2014   HCT 40.8 05/23/2016   MCV 89 05/23/2016   PLT 285 05/23/2016    Lab Results  Component Value Date   CREATININE 0.85 05/23/2016    Lab Results  Component Value Date   AST 16 12/11/2013   Lab Results  Component Value Date   ALT 35 12/11/2013    Pertinent Imaging: Results for RAYLEE, FULWOOD (MRN [id number]) as of 07/12/2016 10:49  Ref. Range 07/06/2016 15:58  Scan Result Unknown 0    Assessment & Plan:    1. Mixed urinary incontinence  - failed anticholinergics, Myrbetriq and PT  -  patient to schedule UDS at this time  2. Frequency  - see  above   3. Cystocele  - see above  4. Kidney stones  - Patient has not had flank pain or gross hematuria over the last year.   - She has a stable left lower pole stone. She will RTC in one year for KUB.    - She will contact the office if she should experience flank pain or gross hematuria.  - taking one Urocit-K daily  - CMP and CBC  - RTC in one year  Return for UDS.  These notes generated with voice recognition software. I apologize for typographical errors.  Zara Council, [facility] [address], [place], [place] [address] [phone]

## 2016-07-12 ENCOUNTER — Telehealth: Payer: Self-pay | Admitting: Urology

## 2016-07-12 NOTE — Telephone Encounter (Signed)
I have schd her for UDS but they need her last OV before her appt on 07-22-16 and I can't fax it until your notes are closed. Please and   Thank you,  michelle

## 2016-07-12 NOTE — Telephone Encounter (Signed)
My note is done

## 2016-08-10 ENCOUNTER — Other Ambulatory Visit: Payer: Self-pay | Admitting: Urology

## 2016-08-15 ENCOUNTER — Ambulatory Visit: Payer: 59 | Admitting: Urology

## 2016-08-15 VITALS — BP 124/85 | HR 98 | Ht 64.0 in | Wt 185.6 lb

## 2016-08-15 DIAGNOSIS — N3946 Mixed incontinence: Secondary | ICD-10-CM | POA: Diagnosis not present

## 2016-08-15 DIAGNOSIS — R350 Frequency of micturition: Secondary | ICD-10-CM

## 2016-08-15 MED ORDER — SOLIFENACIN SUCCINATE 5 MG PO TABS: 5.0000 mg | ORAL_TABLET | Freq: Every day | ORAL | 11 refills | Status: DC

## 2016-08-15 NOTE — Progress Notes (Signed)
08/15/2016 10:54 AM   Teresa Moss September 05, 1970 [id number]  Referring provider: Marinda Elk, MD [facility] [place], [place] [address]  Chief Complaint  Patient presents with  . Follow-up    mixed incontinence     HPI: I was consulted to assess the patient's urinary frequency and urinary incontinence worsening over the last 2 years. She can void twice in an hour due to pressure but not pain. She gets up at least twice at night to urinate. She leaks with coughing sneezing bending and lifting. Sometimes she has urgency incontinence. She does not have enuresis. She does not wear pads. She has failed Detrol.  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 She has milder nocturia. My index of suspicion is low that she has interstitial cystitis. If we did urodynamics it may shed some light on this in the differential diagnosis.  The pathophysiology of incontinence was discussed. The role of physical therapy and medication was discussed. I thought it was very reasonable to try the beta 3 agonist 25 mg samples and prescription. If this failed we would order urodynamics. I mentioned the possibility of interstitial cystitis. She does not have painful intercourse. She failed physical therapy last year.  She had side effects from the beta 3 agonist  Today Frequency and incontinence are stable On urodynamics the patient's bladder capacity was 130 mL. Her bladder was unstable at low volumes. She could hold the contraction but she thinks she leaked a little bit. The capacity was limited by fullness. She did not leak with a Valsalva pressure of 93 cm of water. During voluntary voiding she voided 129 mL with a maximum flow of 10 mils per second. Maximum voiding pressure 13 cm of water and she emptied efficiently. EMG activity increased during the voiding phase. Bladder neck  descended 2-3 cm  PMH: Past Medical History:  Diagnosis Date  . Abnormal vaginal bleeding   . GERD (gastroesophageal reflux disease)   . HLD (hyperlipidemia)   . Kidney stone    Right    Surgical History: Past Surgical History:  Procedure Laterality Date  . CHOLECYSTECTOMY  2012    Home Medications:  Allergies as of 08/15/2016      Reactions   Penicillins Anaphylaxis   Codeine Other (See Comments)   Stimulation/paradoxical agitation   Hydrocodone Other (See Comments)   Paradoxical agitation   Morphine Other (See Comments)   agitation   Promethazine Hcl Other (See Comments)      Medication List       Accurate as of 08/15/16 10:54 AM. Always use your most recent med list.          buPROPion 150 MG 24 hr tablet Commonly known as:  WELLBUTRIN XL Take by mouth.   Cholecalciferol 1000 units tablet Take by mouth.   esomeprazole 40 MG capsule Commonly known as:  NEXIUM   levonorgestrel 20 MCG/24HR IUD Commonly known as:  MIRENA by Intrauterine route.   lovastatin 40 MG tablet Commonly known as:  MEVACOR Take by mouth.   mirabegron ER 25 MG Tb24 tablet Commonly known as:  MYRBETRIQ Take 1 tablet (25 mg total) by mouth daily.   Potassium Citrate 15 MEQ (1620 MG) Tbcr Take 1 tablet by mouth daily.   solifenacin 5 MG tablet Commonly known as:  VESICARE Take 1 tablet (5 mg total) by mouth daily.   tolterodine 2 MG  24 hr capsule Commonly known as:  DETROL LA TAKE 1 CAPSULE BY MOUTH  ONCE DAILY       Allergies:  Allergies  Allergen Reactions  . Penicillins Anaphylaxis  . Codeine Other (See Comments)    Stimulation/paradoxical agitation  . Hydrocodone Other (See Comments)    Paradoxical agitation  . Morphine Other (See Comments)    agitation  . Promethazine Hcl Other (See Comments)    Family History: Family History  Problem Relation Age of Onset  . Heart disease Mother   . Heart disease Father   . Kidney disease Neg Hx   . Bladder Cancer Neg  Hx     Social History:  reports that she has been smoking Cigarettes.  She has a 10.00 pack-year smoking history. She has never used smokeless tobacco. She reports that she drinks about 0.6 oz of alcohol per week . She reports that she does not use drugs.  ROS: UROLOGY Frequent Urination?: Yes Hard to postpone urination?: Yes Burning/pain with urination?: No Get up at night to urinate?: Yes Leakage of urine?: Yes Urine stream starts and stops?: No Trouble starting stream?: No Do you have to strain to urinate?: No Blood in urine?: No Urinary tract infection?: No Sexually transmitted disease?: No Injury to kidneys or bladder?: No Painful intercourse?: No Weak stream?: No Currently pregnant?: No Vaginal bleeding?: No Last menstrual period?: n  Gastrointestinal Nausea?: No Vomiting?: No Indigestion/heartburn?: No Diarrhea?: No Constipation?: No  Constitutional Fever: No Night sweats?: No Weight loss?: No Fatigue?: No  Skin Skin rash/lesions?: No Itching?: No  Eyes Blurred vision?: No Double vision?: No  Ears/Nose/Throat Sore throat?: No Sinus problems?: No  Hematologic/Lymphatic Swollen glands?: No Easy bruising?: No  Cardiovascular Leg swelling?: No Chest pain?: No  Respiratory Cough?: No Shortness of breath?: No  Endocrine Excessive thirst?: No  Musculoskeletal Back pain?: No Joint pain?: No  Neurological Headaches?: No Dizziness?: No  Psychologic Depression?: No Anxiety?: No  Physical Exam: BP 124/85   Pulse 98   Ht 5\' 4"  (1.626 m)   Wt 185 lb 9.6 oz (84.2 kg)   BMI 31.86 kg/m   Constitutional:  Alert and oriented, No acute distress.   Laboratory Data: Lab Results  Component Value Date   WBC 9.9 05/23/2016   HGB 15.5 09/02/2014   HCT 40.8 05/23/2016   MCV 89 05/23/2016   PLT 285 05/23/2016    Lab Results  Component Value Date   CREATININE 0.85 05/23/2016    No results found for: PSA  No results found for:  TESTOSTERONE  No results found for: HGBA1C  Urinalysis    Component Value Date/Time   COLORURINE Yellow 09/02/2014 0125   APPEARANCEUR Cloudy (A) 05/23/2016 1056   LABSPEC 1.029 09/02/2014 0125   PHURINE 5.0 09/02/2014 0125   GLUCOSEU Negative 05/23/2016 1056   GLUCOSEU Negative 09/02/2014 0125   HGBUR 3+ 09/02/2014 0125   BILIRUBINUR Negative 05/23/2016 1056   BILIRUBINUR Negative 09/02/2014 0125   KETONESUR Negative 09/02/2014 0125   PROTEINUR Negative 05/23/2016 1056   PROTEINUR 30 mg/dL 09/02/2014 0125   NITRITE Negative 05/23/2016 1056   NITRITE Negative 09/02/2014 0125   LEUKOCYTESUR Negative 05/23/2016 1056   LEUKOCYTESUR Negative 09/02/2014 0125    Pertinent Imaging: None  Assessment & Plan:  The patient primarily has an overactive bladder. He failed Detrol and had side effects from the beta 3 agonists. She could have interstitial cystitis based upon frequency and pressure. A hydrodistention will likely be recommended in the future if  she does not respond favorably to the next treatment.  Vesicare 5 mg given; see in one month for cysto for smoking history  Described hydrodistension today in some detail  There are no diagnoses linked to this encounter.  Return in about 1 month (around 09/14/2016) for w/ Dr. Matilde Sprang, [place].  Reece Packer, MD  [facility] [address], [place], [place] [address] [phone]

## 2016-09-12 ENCOUNTER — Encounter: Payer: Self-pay | Admitting: Urology

## 2016-09-12 ENCOUNTER — Ambulatory Visit: Payer: 59 | Admitting: Urology

## 2016-09-12 VITALS — BP 115/71 | HR 87 | Ht 64.0 in | Wt 188.1 lb

## 2016-09-12 DIAGNOSIS — Z87448 Personal history of other diseases of urinary system: Secondary | ICD-10-CM

## 2016-09-12 DIAGNOSIS — R350 Frequency of micturition: Secondary | ICD-10-CM

## 2016-09-12 LAB — MICROSCOPIC EXAMINATION

## 2016-09-12 LAB — URINALYSIS, COMPLETE
Bilirubin, UA: NEGATIVE
Glucose, UA: NEGATIVE
KETONES UA: NEGATIVE
LEUKOCYTES UA: NEGATIVE
Nitrite, UA: NEGATIVE
PH UA: 6 (ref 5.0–7.5)
RBC, UA: NEGATIVE
Specific Gravity, UA: 1.015 (ref 1.005–1.030)
Urobilinogen, Ur: 0.2 mg/dL (ref 0.2–1.0)

## 2016-09-12 MED ORDER — CIPROFLOXACIN HCL 500 MG PO TABS
500.0000 mg | ORAL_TABLET | Freq: Once | ORAL | Status: AC
  Administered 2016-09-12: 500 mg via ORAL

## 2016-09-12 MED ORDER — LIDOCAINE HCL 2 % EX GEL
1.0000 "application " | Freq: Once | CUTANEOUS | Status: AC
  Administered 2016-09-12: 1 via URETHRAL

## 2016-09-12 NOTE — Progress Notes (Signed)
09/12/2016 10:54 AM   Teresa Moss Sep 29, 1970 [id number]  Referring provider: Marinda Elk, MD [facility] [place], [place] [address]  Chief Complaint  Patient presents with  . Cysto    HPI: I was consulted to assess the patient's urinary frequency and urinary incontinence worsening over the last 2 years. She can void twice in an hour dueto pressure but not pain. She gets up at least twice at night to urinate. She leaks with coughing sneezing bending and lifting. Sometimes she has urgency incontinence. She does not have enuresis. She does not wear pads. She has failed Detrol.  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 She has milder nocturia. My index of suspicion is low that she has interstitial cystitis.  I thought it was very reasonable to try the beta 3 agonist 25mg  samples and prescription. If this failed we would order urodynamics. I mentioned the possibility of interstitial cystitis. She does not have painful intercourse. She failed physical therapy last year.  She had side effects from the beta 3 agonist  On urodynamics the patient's bladder capacity was 130 mL. Her bladder was unstable at low volumes. She could hold the contraction but she thinks she leaked a little bit. The capacity was limited by fullness. She did not leak with a Valsalva pressure of 93 cm of water. During voluntary voiding she voided 129 mL with a maximum flow of 10 mils per second. Maximum voiding pressure 13 cm of water and she emptied efficiently. EMG activity increased during the voiding phase. Bladder neck descended 2-3 cm  The patient primarily has an overactive bladder. He failed Detrol and had side effects from the beta 3 agonists. She could have interstitial cystitis based upon frequency and pressure. A hydrodistention will likely be recommended in the future  if she does not respond favorably to the next treatment.  Vesicare 5 mg given; see in one month for cysto for smoking history  Today The patient is doing beautifully on Vesicare. She is continent. She can hold urination for a couple of hours. The pressure is gone. She is very happy  After written consent the patient under went cystoscopy. The bladder mucosa and trigone were normal. There is no cystitis. There was no foreign body or carcinoma. She was a little bit tender but tolerated procedure well. There was clear urine from the ureters.   PMH: Past Medical History:  Diagnosis Date  . Abnormal vaginal bleeding   . GERD (gastroesophageal reflux disease)   . HLD (hyperlipidemia)   . Kidney stone    Right    Surgical History: Past Surgical History:  Procedure Laterality Date  . CHOLECYSTECTOMY  2012    Home Medications:  Allergies as of 09/12/2016      Reactions   Penicillins Anaphylaxis   Codeine Other (See Comments)   Stimulation/paradoxical agitation   Hydrocodone Other (See Comments)   Paradoxical agitation   Morphine Other (See Comments)   agitation   Promethazine Hcl Other (See Comments)      Medication List       Accurate as of 09/12/16 10:54 AM. Always use your most recent med list.          buPROPion 150 MG 24 hr tablet Commonly known as:  WELLBUTRIN XL Take by mouth.   Cholecalciferol 1000 units tablet Take by mouth.   esomeprazole 40 MG capsule Commonly known  asJonna Munro   levonorgestrel 20 MCG/24HR IUD Commonly known as:  MIRENA by Intrauterine route.   lovastatin 40 MG tablet Commonly known as:  MEVACOR Take by mouth.   mirabegron ER 25 MG Tb24 tablet Commonly known as:  MYRBETRIQ Take 1 tablet (25 mg total) by mouth daily.   Potassium Citrate 15 MEQ (1620 MG) Tbcr Take 1 tablet by mouth daily.   solifenacin 5 MG tablet Commonly known as:  VESICARE Take 1 tablet (5 mg total) by mouth daily.   tolterodine 2 MG 24 hr capsule Commonly  known as:  DETROL LA TAKE 1 CAPSULE BY MOUTH  ONCE DAILY       Allergies:  Allergies  Allergen Reactions  . Penicillins Anaphylaxis  . Codeine Other (See Comments)    Stimulation/paradoxical agitation  . Hydrocodone Other (See Comments)    Paradoxical agitation  . Morphine Other (See Comments)    agitation  . Promethazine Hcl Other (See Comments)    Family History: Family History  Problem Relation Age of Onset  . Heart disease Mother   . Heart disease Father   . Kidney disease Neg Hx   . Bladder Cancer Neg Hx     Social History:  reports that she has been smoking Cigarettes.  She has a 10.00 pack-year smoking history. She has never used smokeless tobacco. She reports that she drinks about 0.6 oz of alcohol per week . She reports that she does not use drugs.  ROS:                                        Physical Exam: BP 115/71 (BP Location: Left Arm, Patient Position: Sitting, Cuff Size: Normal)   Pulse 87   Ht 5\' 4"  (1.626 m)   Wt 188 lb 1.6 oz (85.3 kg)   BMI 32.29 kg/m    Laboratory Data: Lab Results  Component Value Date   WBC 9.9 05/23/2016   HGB 15.5 09/02/2014   HCT 40.8 05/23/2016   MCV 89 05/23/2016   PLT 285 05/23/2016    Lab Results  Component Value Date   CREATININE 0.85 05/23/2016    No results found for: PSA  No results found for: TESTOSTERONE  No results found for: HGBA1C  Urinalysis    Component Value Date/Time   COLORURINE Yellow 09/02/2014 0125   APPEARANCEUR Cloudy (A) 05/23/2016 1056   LABSPEC 1.029 09/02/2014 0125   PHURINE 5.0 09/02/2014 0125   GLUCOSEU Negative 05/23/2016 1056   GLUCOSEU Negative 09/02/2014 0125   HGBUR 3+ 09/02/2014 0125   BILIRUBINUR Negative 05/23/2016 1056   BILIRUBINUR Negative 09/02/2014 0125   KETONESUR Negative 09/02/2014 0125   PROTEINUR Negative 05/23/2016 1056   PROTEINUR 30 mg/dL 09/02/2014 0125   NITRITE Negative 05/23/2016 1056   NITRITE Negative 09/02/2014 0125     LEUKOCYTESUR Negative 05/23/2016 1056   LEUKOCYTESUR Negative 09/02/2014 0125    Pertinent Imaging: nnone  Assessment & Plan:  The patient has done very well on Vesicare. She likely does not have interstitial cystitis. I would like to reevaluate her on Vesicare in about 4 months  1. History of hematuria 2. Urinary frequency - Urinalysis, Complete - lidocaine (XYLOCAINE) 2 % jelly 1 application; Place 1 application into the urethra once. - ciprofloxacin (CIPRO) tablet 500 mg; Take 1 tablet (500 mg total) by mouth once.   No Follow-up on file.  Dnaiel Voller A, MD  [facility] [address] [place],  [address] [phone]

## 2016-10-17 DIAGNOSIS — K219 Gastro-esophageal reflux disease without esophagitis: Secondary | ICD-10-CM | POA: Diagnosis not present

## 2016-10-27 ENCOUNTER — Other Ambulatory Visit: Payer: Self-pay

## 2016-10-27 DIAGNOSIS — N3946 Mixed incontinence: Secondary | ICD-10-CM

## 2016-10-27 MED ORDER — SOLIFENACIN SUCCINATE 5 MG PO TABS: 5.0000 mg | ORAL_TABLET | Freq: Every day | ORAL | 2 refills | Status: DC

## 2016-10-27 NOTE — Telephone Encounter (Signed)
Patient called to inform Vesicare Rx needed to be updated w/ [facility]. Received fax also. Previous Vesicare Rx was qty: 30 refills: 11. Updated to qty: 90 refills: 2.

## 2016-10-31 ENCOUNTER — Other Ambulatory Visit: Payer: Self-pay | Admitting: Obstetrics and Gynecology

## 2016-10-31 DIAGNOSIS — Z1231 Encounter for screening mammogram for malignant neoplasm of breast: Secondary | ICD-10-CM

## 2016-10-31 DIAGNOSIS — R0602 Shortness of breath: Secondary | ICD-10-CM | POA: Diagnosis not present

## 2016-10-31 DIAGNOSIS — R079 Chest pain, unspecified: Secondary | ICD-10-CM | POA: Diagnosis not present

## 2016-10-31 DIAGNOSIS — Z124 Encounter for screening for malignant neoplasm of cervix: Secondary | ICD-10-CM | POA: Diagnosis not present

## 2016-10-31 DIAGNOSIS — Z01419 Encounter for gynecological examination (general) (routine) without abnormal findings: Secondary | ICD-10-CM | POA: Diagnosis not present

## 2016-11-01 ENCOUNTER — Other Ambulatory Visit: Payer: Self-pay

## 2016-11-01 DIAGNOSIS — N3946 Mixed incontinence: Secondary | ICD-10-CM

## 2016-11-01 MED ORDER — SOLIFENACIN SUCCINATE 5 MG PO TABS
5.0000 mg | ORAL_TABLET | Freq: Every day | ORAL | 2 refills | Status: DC
Start: 2016-11-01 — End: 2018-10-19

## 2016-11-07 DIAGNOSIS — R079 Chest pain, unspecified: Secondary | ICD-10-CM | POA: Diagnosis not present

## 2016-11-29 ENCOUNTER — Ambulatory Visit
Admission: RE | Admit: 2016-11-29 | Discharge: 2016-11-29 | Disposition: A | Discharge status: Home / Self Care | Payer: 59 | Source: Ambulatory Visit | Attending: Obstetrics and Gynecology | Admitting: Obstetrics and Gynecology

## 2016-11-29 DIAGNOSIS — Z1231 Encounter for screening mammogram for malignant neoplasm of breast: Secondary | ICD-10-CM | POA: Insufficient documentation

## 2016-12-01 ENCOUNTER — Encounter: Payer: Self-pay | Admitting: Urology

## 2016-12-01 ENCOUNTER — Ambulatory Visit: Payer: 59 | Admitting: Urology

## 2016-12-01 VITALS — BP 139/81 | HR 93 | Ht 64.0 in | Wt 185.8 lb

## 2016-12-01 DIAGNOSIS — R102 Pelvic and perineal pain: Secondary | ICD-10-CM

## 2016-12-01 DIAGNOSIS — M545 Low back pain, unspecified: Secondary | ICD-10-CM

## 2016-12-01 DIAGNOSIS — N3946 Mixed incontinence: Secondary | ICD-10-CM

## 2016-12-01 LAB — URINALYSIS, COMPLETE
BILIRUBIN UA: NEGATIVE
GLUCOSE, UA: NEGATIVE
KETONES UA: NEGATIVE
LEUKOCYTES UA: NEGATIVE
Nitrite, UA: NEGATIVE
PROTEIN UA: NEGATIVE
RBC UA: NEGATIVE
Scan Result: 0
Urobilinogen, Ur: 0.2 mg/dL (ref 0.2–1.0)
pH, UA: 6 (ref 5.0–7.5)

## 2016-12-01 LAB — MICROSCOPIC EXAMINATION

## 2016-12-01 MED ORDER — NITROFURANTOIN MONOHYD MACRO 100 MG PO CAPS: 100.0000 mg | ORAL_CAPSULE | Freq: Two times a day (BID) | ORAL | 0 refills | Status: DC

## 2016-12-01 NOTE — Progress Notes (Signed)
12/01/2016 2:47 PM   Teresa Moss 01-02-1971 [id number]  Referring provider: Marinda Elk, MD [place] [facility] [place], [place] [address]  Chief Complaint  Patient presents with  . Follow-up    bladder pressure and back pain    HPI: Patient is a 46 year old Caucasian female who is requesting an urgent appointment for bladder pressure and back pain.    She states it started about two weeks ago.  She first thought she strained her back, but then the pelvic pressure started.  She increased her fluids to try to ease it off, but she continued to the pelvic pressure.  She states it is irritating.  The pressure is constant.  She has not noted anything that made the pain worse or better.  She has not had fevers, chills, nausea or vomiting.  She denies dysuria, gross hematuria and suprapubic pain.    She has not had any vaginal discharge or itching.  She did have sexual intercourse at the beach.    She has a history of stones, but she does not feel like this is similar pain to her previous stones.    Her UA was unremarkable.  Her PVR was 0 mL.     PMH: Past Medical History:  Diagnosis Date  . Abnormal vaginal bleeding   . GERD (gastroesophageal reflux disease)   . HLD (hyperlipidemia)   . Kidney stone    Right    Surgical History: Past Surgical History:  Procedure Laterality Date  . CHOLECYSTECTOMY  2012    Home Medications:  Allergies as of 12/01/2016      Reactions   Penicillins Anaphylaxis   Codeine Other (See Comments)   Stimulation/paradoxical agitation   Hydrocodone Other (See Comments)   Paradoxical agitation   Morphine Other (See Comments)   agitation   Promethazine Hcl Other (See Comments)      Medication List       Accurate as of 12/01/16  2:47 PM. Always use your most recent med list.          buPROPion 150 MG 24 hr tablet Commonly known as:  WELLBUTRIN XL Take by mouth.   Cholecalciferol 1000 units tablet Take by  mouth.   esomeprazole 40 MG capsule Commonly known as:  NEXIUM   levonorgestrel 20 MCG/24HR IUD Commonly known as:  MIRENA by Intrauterine route.   lovastatin 40 MG tablet Commonly known as:  MEVACOR Take by mouth.   nitrofurantoin (macrocrystal-monohydrate) 100 MG capsule Commonly known as:  MACROBID Take 1 capsule (100 mg total) by mouth every 12 (twelve) hours.   pantoprazole 40 MG tablet Commonly known as:  PROTONIX Take by mouth.   Potassium Citrate 15 MEQ (1620 MG) Tbcr Take 1 tablet by mouth daily.   solifenacin 5 MG tablet Commonly known as:  VESICARE Take 1 tablet (5 mg total) by mouth daily.       Allergies:  Allergies  Allergen Reactions  . Penicillins Anaphylaxis  . Codeine Other (See Comments)    Stimulation/paradoxical agitation  . Hydrocodone Other (See Comments)    Paradoxical agitation  . Morphine Other (See Comments)    agitation  . Promethazine Hcl Other (See Comments)    Family History: Family History  Problem Relation Age of Onset  . Heart disease Mother   . Heart disease Father   . Bladder Cancer Father   . Kidney disease Neg Hx   . Kidney cancer Neg Hx     Social  History:  reports that she has been smoking Cigarettes.  She has a 10.00 pack-year smoking history. She has never used smokeless tobacco. She reports that she drinks about 0.6 oz of alcohol per week . She reports that she does not use drugs.  ROS: UROLOGY Frequent Urination?: No Hard to postpone urination?: Yes Burning/pain with urination?: No Get up at night to urinate?: Yes Leakage of urine?: No Urine stream starts and stops?: No Trouble starting stream?: No Do you have to strain to urinate?: No Blood in urine?: No Urinary tract infection?: No Sexually transmitted disease?: No Injury to kidneys or bladder?: No Painful intercourse?: No Weak stream?: No Currently pregnant?: No Vaginal bleeding?: No Last menstrual period?: n  Gastrointestinal Nausea?:  No Vomiting?: No Indigestion/heartburn?: No Diarrhea?: No Constipation?: No  Constitutional Fever: No Night sweats?: No Weight loss?: No Fatigue?: No  Skin Skin rash/lesions?: No Itching?: No  Eyes Blurred vision?: No Double vision?: No  Ears/Nose/Throat Sore throat?: No Sinus problems?: No  Hematologic/Lymphatic Swollen glands?: No Easy bruising?: No  Cardiovascular Leg swelling?: No Chest pain?: No  Respiratory Cough?: No Shortness of breath?: No  Endocrine Excessive thirst?: No  Musculoskeletal Back pain?: No Joint pain?: No  Neurological Headaches?: No Dizziness?: No  Psychologic Depression?: No Anxiety?: No  Physical Exam: BP 139/81   Pulse 93   Ht 5\' 4"  (1.626 m)   Wt 185 lb 12.8 oz (84.3 kg)   BMI 31.89 kg/m   Constitutional: Well nourished. Alert and oriented, No acute distress. HEENT: [place] AT, moist mucus membranes. Trachea midline, no masses. Cardiovascular: No clubbing, cyanosis, or edema. Respiratory: Normal respiratory effort, no increased work of breathing. GI: Abdomen is soft, non tender, non distended, no abdominal masses. Liver and spleen not palpable.  No hernias appreciated.  Stool sample for occult testing is not indicated.   GU: No CVA tenderness.  No bladder fullness or masses.  Normal external genitalia, normal pubic hair distribution, no lesions.  Normal urethral meatus, no lesions, no prolapse, no discharge.   No urethral masses, tenderness and/or tenderness. No bladder fullness, tenderness or masses. Normal vagina mucosa, good estrogen effect, no discharge, no lesions, good pelvic support, no cystocele or rectocele noted.  No cervical motion tenderness.  Uterus is freely mobile and non-fixed.  No adnexal/parametria masses or tenderness noted.  Anus and perineum are without rashes or lesions.    Skin: No rashes, bruises or suspicious lesions. Lymph: No cervical or inguinal adenopathy. Neurologic: Grossly intact, no focal  deficits, moving all 4 extremities. Psychiatric: Normal mood and affect.  Laboratory Data: Lab Results  Component Value Date   WBC 9.9 05/23/2016   HGB 13.5 05/23/2016   HCT 40.8 05/23/2016   MCV 89 05/23/2016   PLT 285 05/23/2016    Lab Results  Component Value Date   CREATININE 0.85 05/23/2016    Urinalysis Unremarkable.  See EPIC.    Pertinent Imaging: Results for NATANYA, HOLECEK (MRN [id number]) as of 12/01/2016 14:37  Ref. Range 12/01/2016 14:12  Scan Result Unknown 0    Assessment & Plan:    1. Pelvic pressure  - UA was unremarkable - will send for culture - will treat empirically with Macrobid - will adjust if necessary once culture has returned   - if culture is negative and still symptomatic - will consider CT due to her history of stones  2. Back pain  - see above      Return for will call the patient with the results.  Zara Council, [facility] [address] [place], [place] [address] [phone]

## 2016-12-02 IMAGING — MG MM DIGITAL SCREENING BILAT W/ CAD
4 series · 4 of 4 positions shown · non-contrast
Comparison: Previous exam(s).

CLINICAL DATA: Screening.

EXAM:
DIGITAL SCREENING BILATERAL MAMMOGRAM WITH CAD

[R MLO]
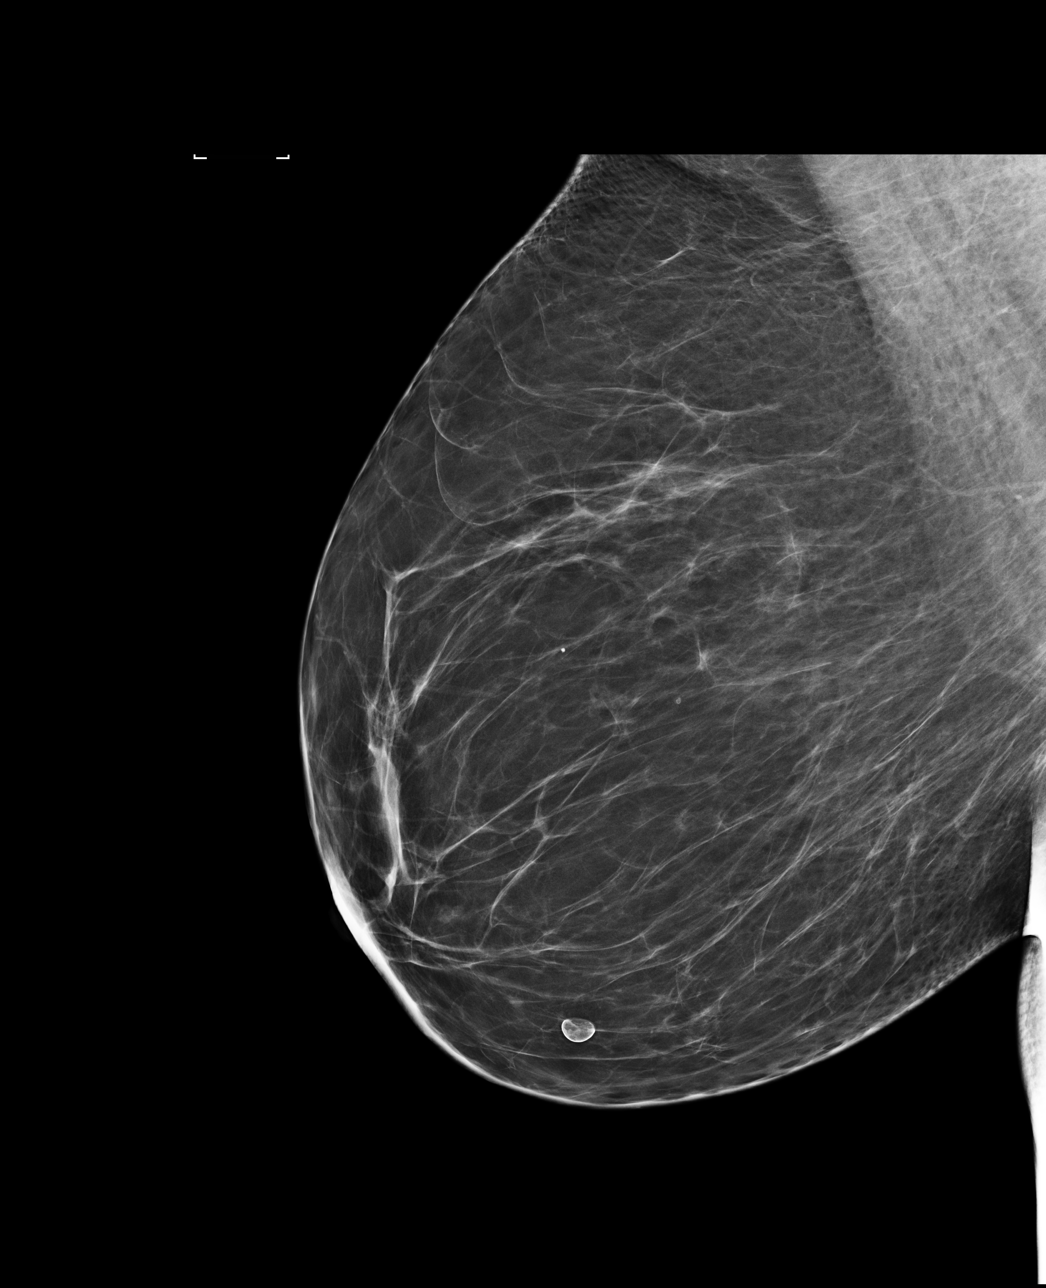

[L MLO]
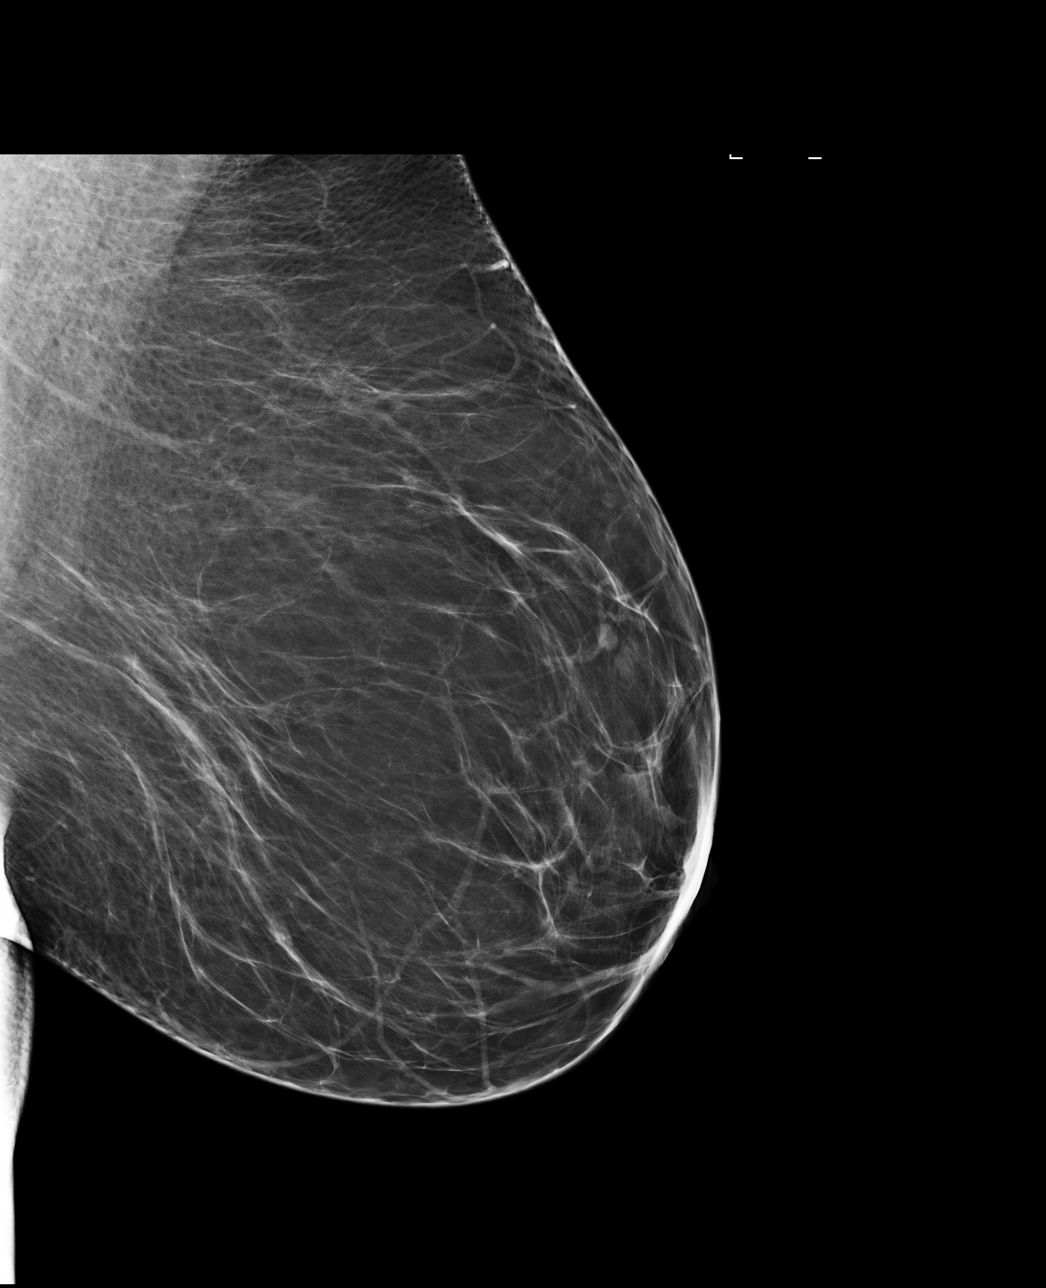

[L CC]
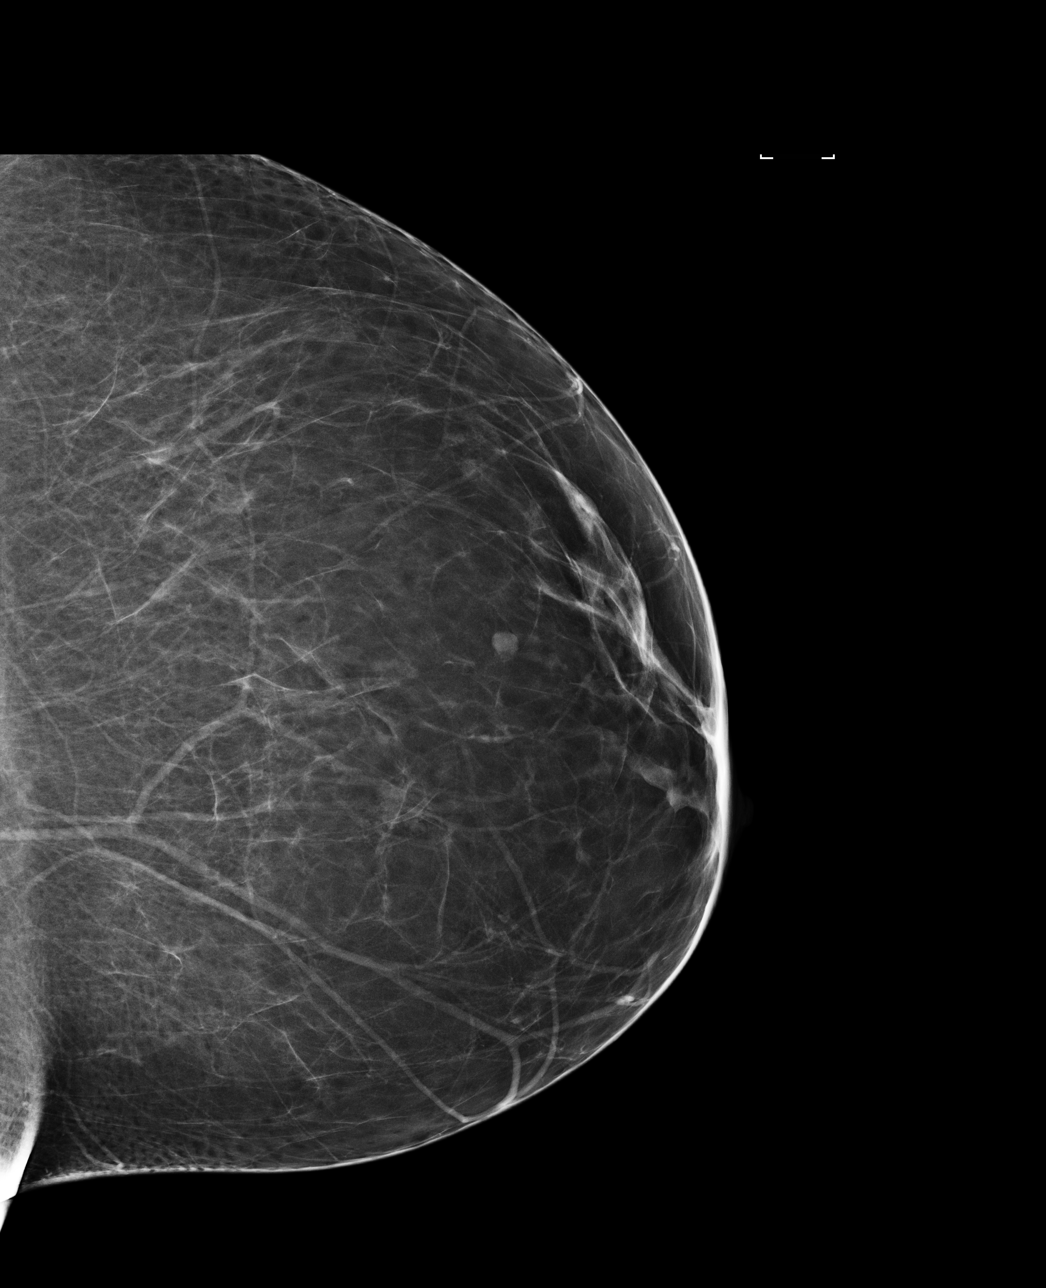

[R CC]
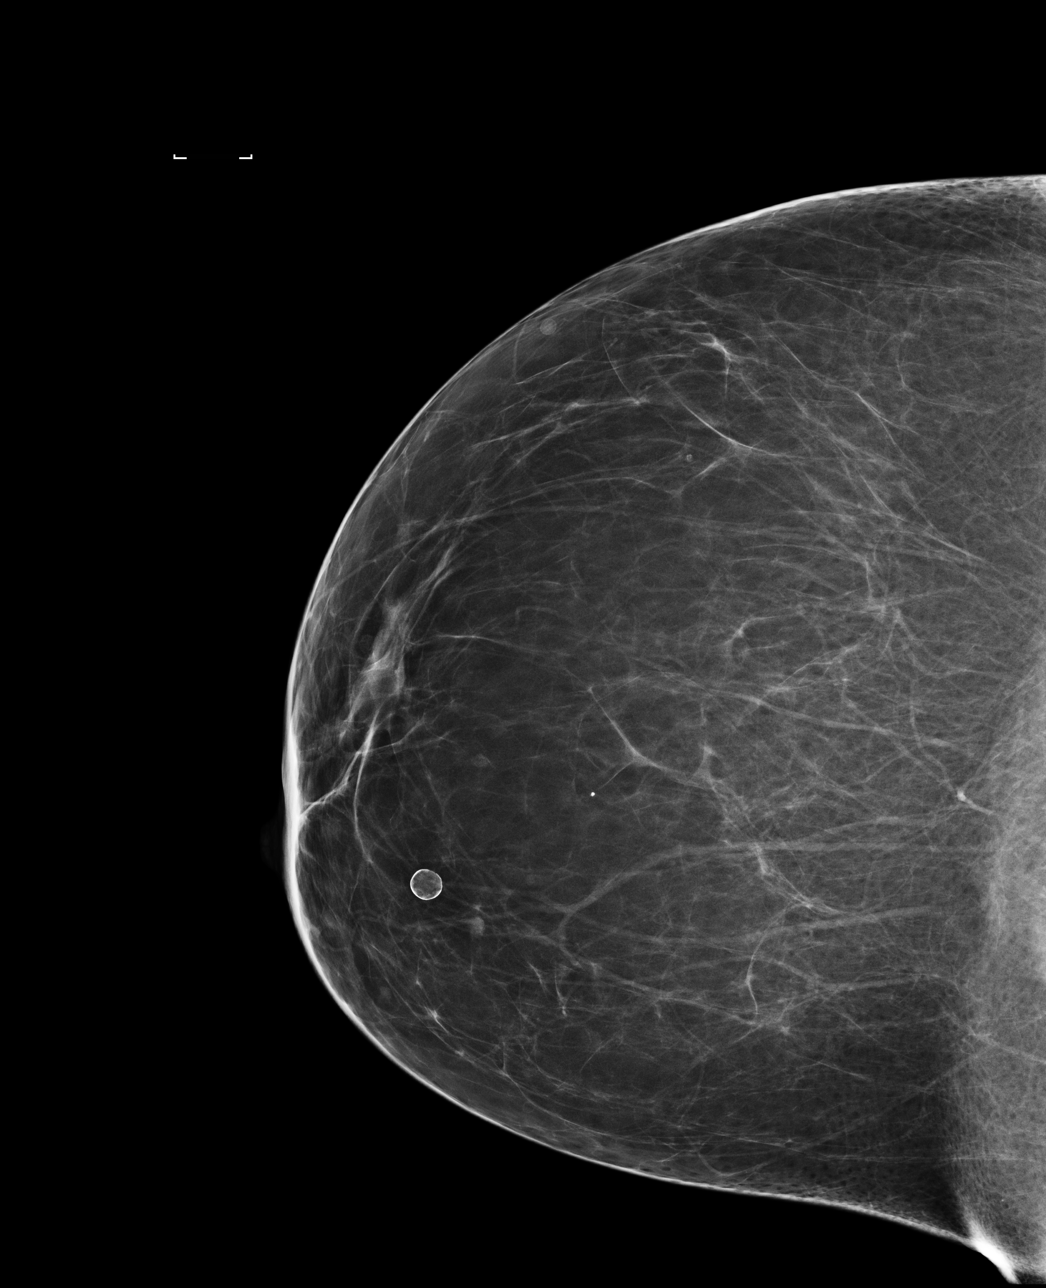

[4 of 4 positions shown; findings below may reference images not displayed]

ACR Breast Density Category b: There are scattered areas of
fibroglandular density.
FINDINGS: There are no findings suspicious for malignancy. Images were
processed with CAD.
IMPRESSION: No mammographic evidence of malignancy. A result letter of this
screening mammogram will be mailed directly to the patient.

RECOMMENDATION:
Screening mammogram in one year. (Code:AS-G-LCT)

BI-RADS CATEGORY  1: Negative.

## 2016-12-04 LAB — CULTURE, URINE COMPREHENSIVE

## 2016-12-05 ENCOUNTER — Telehealth: Payer: Self-pay

## 2016-12-05 NOTE — Telephone Encounter (Signed)
-----   Message from [name], PA-C sent at 12/04/2016  5:27 PM EDT ----- Please let Mrs. Dominique know that her urine culture was negative.  If she is still having symptoms, I suggest she have a CT Renal Stone study.

## 2016-12-05 NOTE — Telephone Encounter (Signed)
Spoke with pt in reference to ucx results. Pt stated at this time she is not having s/s anymore but will call back if s/s return.

## 2016-12-06 DIAGNOSIS — E7800 Pure hypercholesterolemia, unspecified: Secondary | ICD-10-CM | POA: Diagnosis not present

## 2016-12-07 ENCOUNTER — Telehealth: Payer: Self-pay | Admitting: Urology

## 2016-12-07 ENCOUNTER — Other Ambulatory Visit: Payer: Self-pay | Admitting: Urology

## 2016-12-07 DIAGNOSIS — R109 Unspecified abdominal pain: Secondary | ICD-10-CM

## 2016-12-07 NOTE — Telephone Encounter (Signed)
Patient called today and stated that she was not feeling any better and would like to proceed with the CT scan. I will need an order for this please and thank you.   Sharyn Lull

## 2016-12-07 NOTE — Telephone Encounter (Signed)
Done

## 2016-12-21 ENCOUNTER — Ambulatory Visit
Admission: RE | Admit: 2016-12-21 | Discharge: 2016-12-21 | Disposition: A | Discharge status: Home / Self Care | Payer: 59 | Source: Ambulatory Visit | Attending: Urology | Admitting: Urology

## 2016-12-21 DIAGNOSIS — R160 Hepatomegaly, not elsewhere classified: Secondary | ICD-10-CM | POA: Insufficient documentation

## 2016-12-21 DIAGNOSIS — R109 Unspecified abdominal pain: Secondary | ICD-10-CM

## 2016-12-21 DIAGNOSIS — N200 Calculus of kidney: Secondary | ICD-10-CM | POA: Diagnosis not present

## 2016-12-22 ENCOUNTER — Telehealth: Payer: Self-pay

## 2016-12-22 NOTE — Telephone Encounter (Signed)
-----   Message from [name], PA-C sent at 12/21/2016  3:22 PM EDT ----- Please let Teresa Moss know that her CT scan demonstrated very tiny stones in her left and right kidneys.  Is she still having symptoms?

## 2016-12-22 NOTE — Telephone Encounter (Signed)
Spoke with pt in reference to CT results. Pt stated that she continues to have pressure but all other symptoms have gone away.

## 2017-01-03 DIAGNOSIS — Z720 Tobacco use: Secondary | ICD-10-CM | POA: Diagnosis not present

## 2017-01-03 DIAGNOSIS — E7800 Pure hypercholesterolemia, unspecified: Secondary | ICD-10-CM | POA: Diagnosis not present

## 2017-01-20 NOTE — Telephone Encounter (Signed)
Spoke with patient and gave message. Patient states first time hearing about IC and she didn't understand what I was talking about with instillations. I booked patient appointment to see Zara Council September 27th at 3:00pm to discuss. Patient did stated was not having bladder pressure but started back this week. Patient ok with plan.

## 2017-01-20 NOTE — Telephone Encounter (Signed)
I have reviewed Dr. Mikle Bosworth note and he was suspicious at one time that she may have interstitial cystitis.  If the bladder pressure is still present, we could try rescue instillations.  This is one treatment for IC.

## 2017-02-02 ENCOUNTER — Ambulatory Visit: Payer: 59 | Admitting: Urology

## 2017-02-15 DIAGNOSIS — Z23 Encounter for immunization: Secondary | ICD-10-CM | POA: Diagnosis not present

## 2017-03-14 IMAGING — CT CT RENAL STONE PROTOCOL
2 of 4 series · 17 of 46 positions shown, 19 images · non-contrast
Comparison: 09/02/2014

CLINICAL DATA: Pelvic pressure and back pain. History of renal
calculus.

EXAM:
CT ABDOMEN AND PELVIS WITHOUT CONTRAST
TECHNIQUE: Multidetector CT imaging of the abdomen and pelvis was performed
following the standard protocol without IV contrast.

[Series 2: stone · axial · 0.79mm/px · z∈[-1058,-698]mm · 14 of 78 slices shown, 16 images]
[im 3/78  soft-tissue]
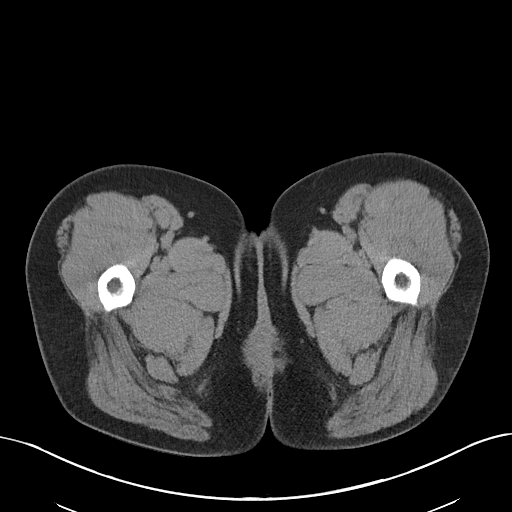
[im 3/78  bone]
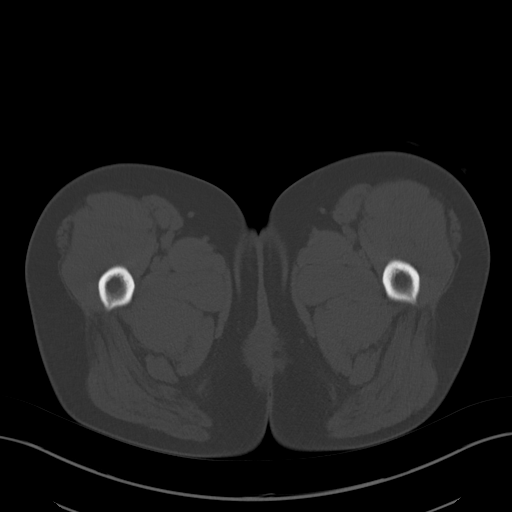
[im 9/78  soft-tissue]
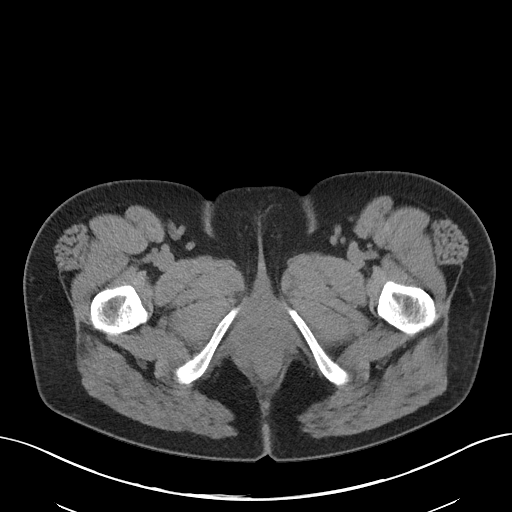
[im 15/78  soft-tissue]
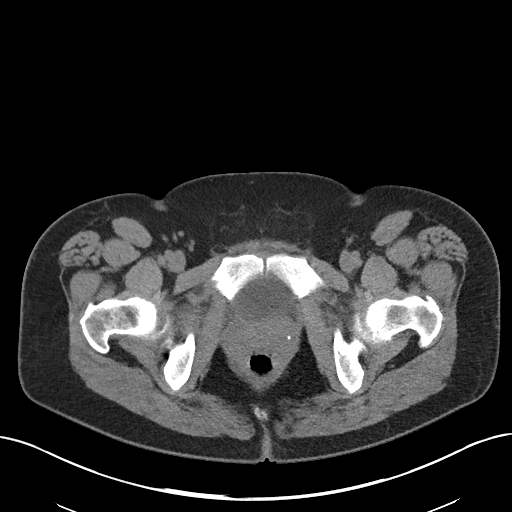
[im 20/78  soft-tissue]
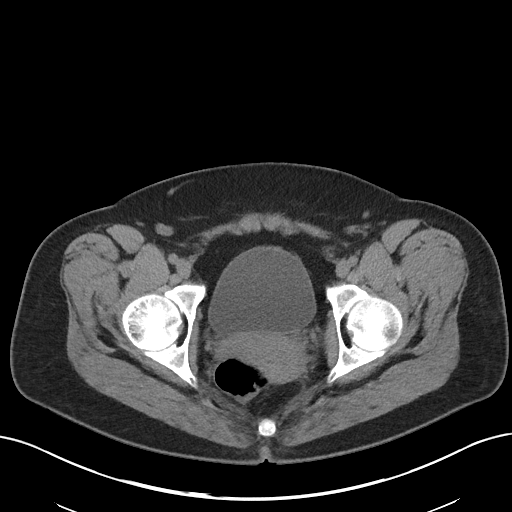
[im 26/78  soft-tissue]
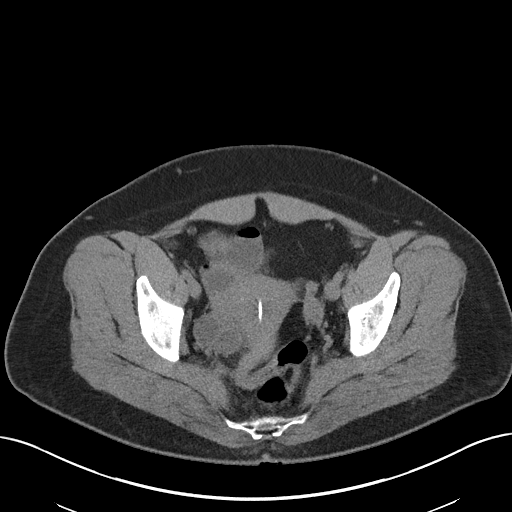
[im 32/78  soft-tissue]
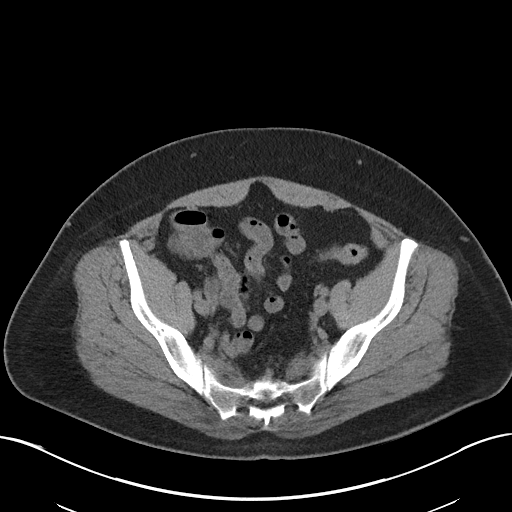
[im 38/78  soft-tissue]
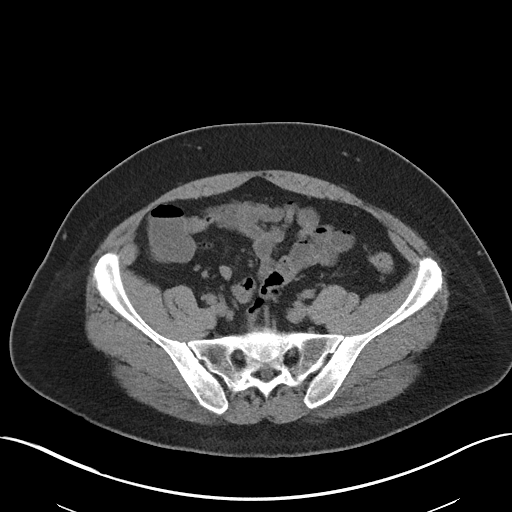
[im 40/78  soft-tissue]
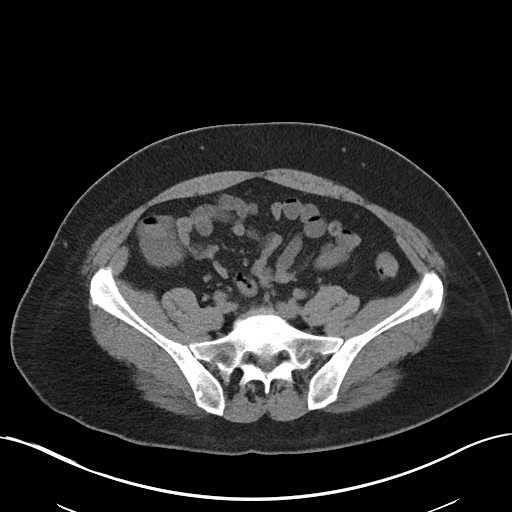
[im 46/78  soft-tissue]
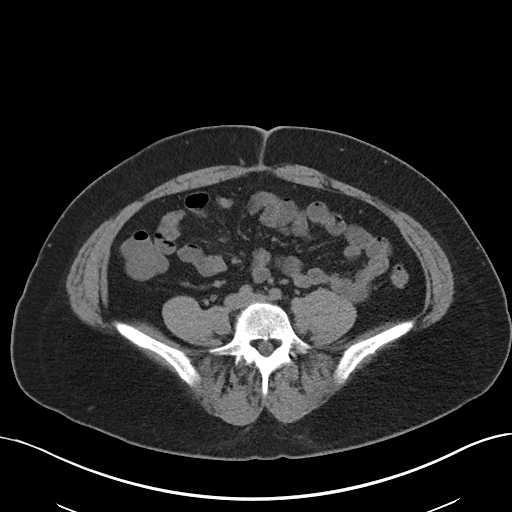
[im 46/78  bone]
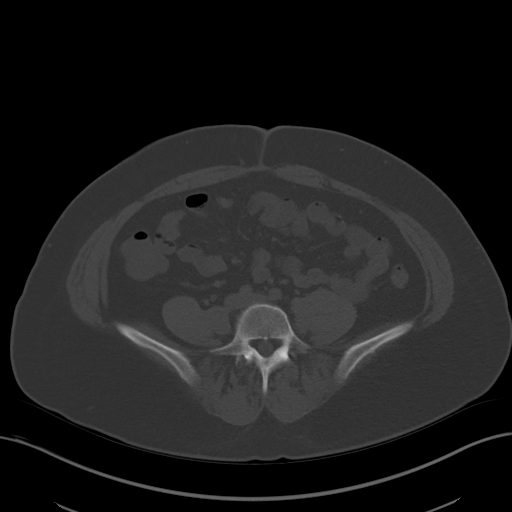
[im 52/78  soft-tissue]
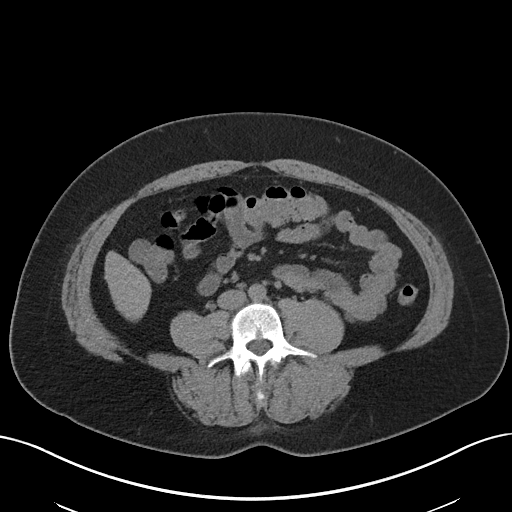
[im 58/78  soft-tissue]
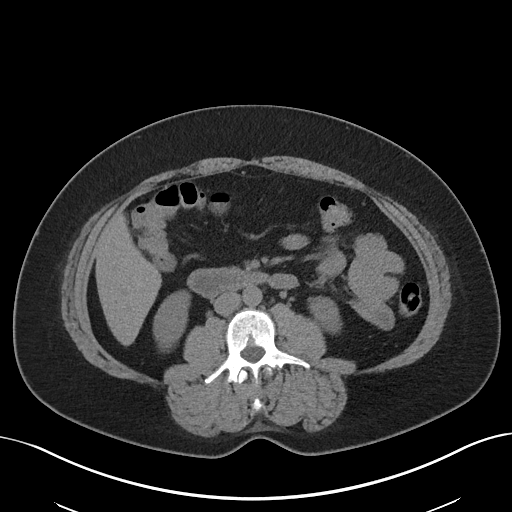
[im 63/78  soft-tissue]
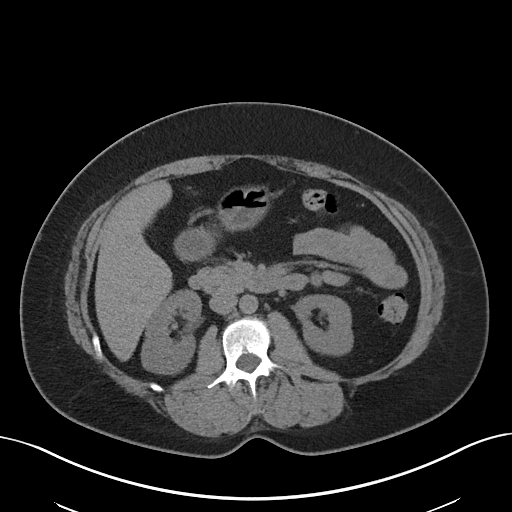
[im 69/78  soft-tissue]
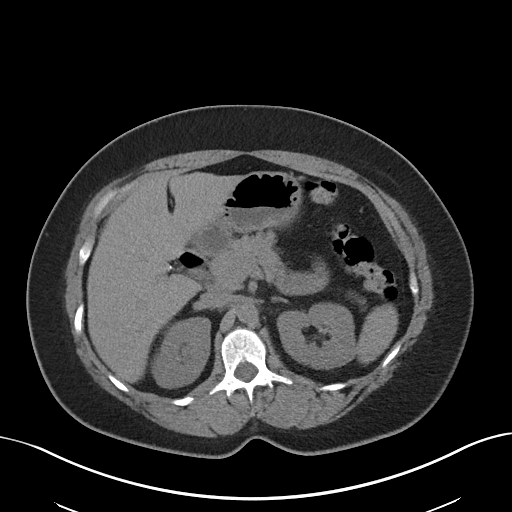
[im 75/78  soft-tissue]
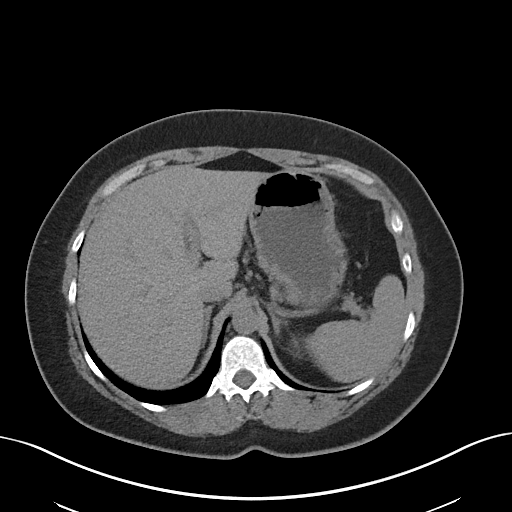

[Series 5: cor · coronal · 0.70mm/px · 3 of 146 slices shown]
[im 49/146  soft-tissue]
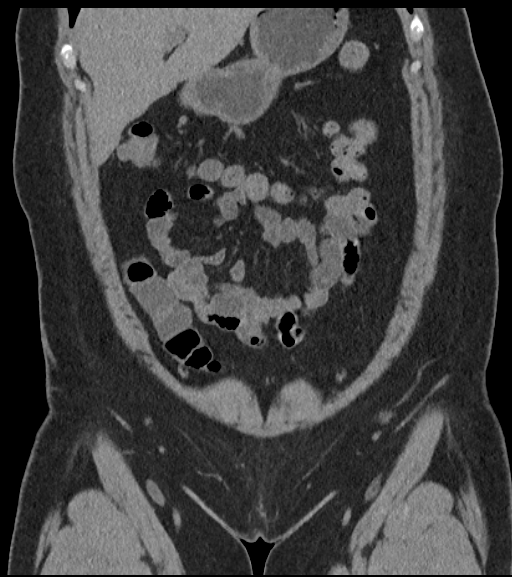
[im 65/146  soft-tissue]
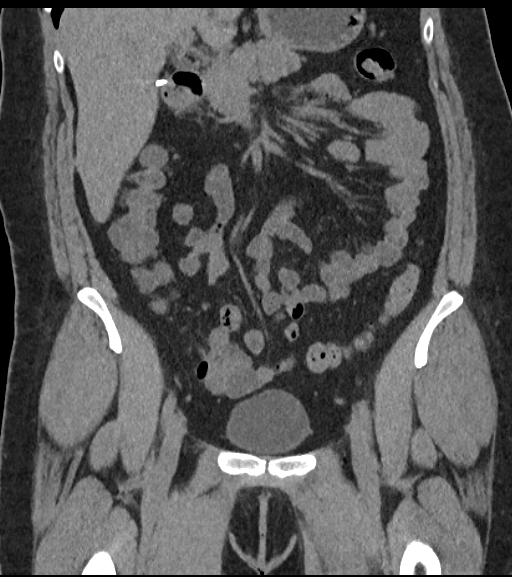
[im 81/146  soft-tissue]
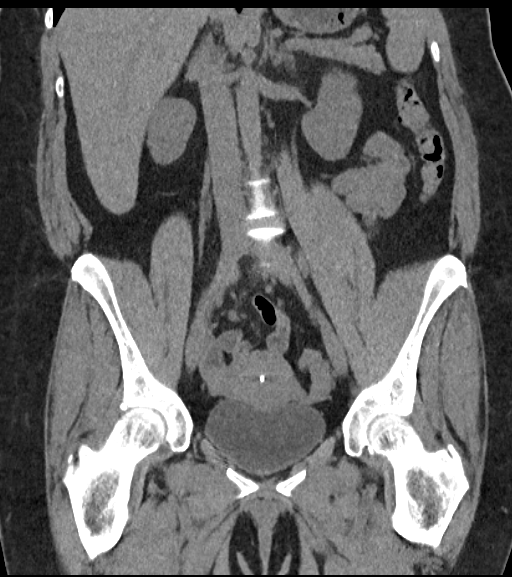

[17 of 46 positions shown; findings below may reference images not displayed]

FINDINGS: Right hydronephrosis and distal right ureteral calculus have
resolved. Stable pelvic phleboliths. Stable calculus in the lower
pole of the left kidney.

Visualized liver, spleen, pancreas, and adrenal glands are within
normal limits

Postcholecystectomy

IUD is stable within the endometrium. Unremarkable bladder and
adnexa. There are cystic changes in the right ovary.

Sigmoid diverticulosis.

No vertebral compression.
IMPRESSION: Previously visualized distal right ureteral calculus and right
hydronephrosis have resolved.

Stable left nephrolithiasis.

## 2017-03-20 ENCOUNTER — Ambulatory Visit: Payer: 59 | Admitting: Urology

## 2017-03-23 NOTE — Progress Notes (Signed)
03/24/2017 9:42 AM   Teresa Moss April 04, 1971 [id number]  Referring provider: Marinda Elk, MD [place] [facility] [place], [place] [address]  Chief Complaint  Patient presents with  . Pelvic Pressure  . Mixed Incontinence  . Urinary Frequency    HPI: Patient is a 46 year old Caucasian female with OAB, mixed incontinence and a history of nephrolithiasis who presents today for a 6 month follow up.    OAB Patient has been evaluated with UDS and cystoscopy.  No worrisome findings.  She is currently on Vesicare 5 mg daily.  Her major complaints are frequency, urgency, nocturia and mixed incontinence.  Today, she is experiencing urgency x 0-3, frequency x 4-7, not restricting fluids to avoid visits to the restroom, is engaging in toilet mapping, incontinence x 4-7 and nocturia x 0-3.    Her PVR is 24 mL.  She has been without Vesicare for a couple of weeks as she is finding the medication cost prohibitive and could not refill the medication.    Mixed incontinence See above  History of nephrolithiasis Renal CT study performed in 12/2016 noted adrenal glands and right kidney are unremarkable. Punctate stones in the left kidney. Ureters are decompressed. Bladder is low in volume.  No recent flank pain or gross hematuria.      PMH: Past Medical History:  Diagnosis Date  . Abnormal vaginal bleeding   . GERD (gastroesophageal reflux disease)   . HLD (hyperlipidemia)   . Kidney stone    Right    Surgical History: Past Surgical History:  Procedure Laterality Date  . CHOLECYSTECTOMY  2012    Home Medications:  Allergies as of 03/24/2017      Reactions   Codeine Other (See Comments)   Stimulation/paradoxical agitation   Hydrocodone Other (See Comments)   Paradoxical agitation   Macrobid [nitrofurantoin] Other (See Comments)   Profuse Sweating, Hot flashes, and tremors   Morphine Other (See Comments)   agitation   Penicillins Other (See Comments)   Unknown Reaction- Occurred as a child   Promethazine Hcl Other (See Comments)      Medication List        Accurate as of 03/24/17  9:42 AM. Always use your most recent med list.          Cholecalciferol 1000 units tablet Take 1,000 Units daily by mouth.   levonorgestrel 20 MCG/24HR IUD Commonly known as:  MIRENA 1 each once by Intrauterine route.   lovastatin 40 MG tablet Commonly known as:  MEVACOR Take 40 mg at bedtime by mouth.   oxybutynin 15 MG 24 hr tablet Commonly known as:  DITROPAN XL Take 1 tablet (15 mg total) at bedtime by mouth.   pantoprazole 40 MG tablet Commonly known as:  PROTONIX Take 40 mg daily by mouth.   Potassium Citrate 15 MEQ (1620 MG) Tbcr Take 1 tablet by mouth daily.   solifenacin 5 MG tablet Commonly known as:  VESICARE Take 1 tablet (5 mg total) by mouth daily.       Allergies:  Allergies  Allergen Reactions  . Codeine Other (See Comments)    Stimulation/paradoxical agitation  . Hydrocodone Other (See Comments)    Paradoxical agitation  . Macrobid [Nitrofurantoin] Other (See Comments)    Profuse Sweating, Hot flashes, and tremors  . Morphine Other (See Comments)    agitation  . Penicillins Other (See Comments)    Unknown Reaction- Occurred as a child  . Promethazine Hcl Other (See  Comments)    Family History: Family History  Problem Relation Age of Onset  . Heart disease Mother   . Heart disease Father   . Bladder Cancer Father   . Kidney disease Neg Hx   . Kidney cancer Neg Hx     Social History:  reports that she has been smoking cigarettes.  She has a 10.00 pack-year smoking history. she has never used smokeless tobacco. She reports that she drinks about 0.6 oz of alcohol per week. She reports that she does not use drugs.  ROS: UROLOGY Frequent Urination?: Yes Hard to postpone urination?: Yes Burning/pain with urination?: No Get up at night to urinate?: Yes Leakage of urine?: Yes Urine stream starts and  stops?: No Trouble starting stream?: No Do you have to strain to urinate?: No Blood in urine?: No Urinary tract infection?: No Sexually transmitted disease?: No Injury to kidneys or bladder?: No Painful intercourse?: No Weak stream?: No Currently pregnant?: No Vaginal bleeding?: No  Gastrointestinal Nausea?: No Vomiting?: No Indigestion/heartburn?: No Diarrhea?: No Constipation?: No  Constitutional Fever: No Night sweats?: No Weight loss?: No Fatigue?: No  Skin Skin rash/lesions?: No Itching?: No  Eyes Blurred vision?: No Double vision?: No  Ears/Nose/Throat Sore throat?: No Sinus problems?: No  Hematologic/Lymphatic Swollen glands?: No Easy bruising?: No  Cardiovascular Leg swelling?: No Chest pain?: No  Respiratory Cough?: No Shortness of breath?: No  Endocrine Excessive thirst?: No  Musculoskeletal Back pain?: No Joint pain?: No  Neurological Headaches?: No Dizziness?: No  Psychologic Depression?: No Anxiety?: No  Physical Exam: BP (!) 147/87   Pulse 86   Ht 5\' 4"  (1.626 m)   Wt 186 lb 8 oz (84.6 kg)   BMI 32.01 kg/m   Constitutional: Well nourished. Alert and oriented, No acute distress. HEENT:  AT, moist mucus membranes. Trachea midline, no masses. Cardiovascular: No clubbing, cyanosis, or edema. Respiratory: Normal respiratory effort, no increased work of breathing. Skin: No rashes, bruises or suspicious lesions. Lymph: No cervical or inguinal adenopathy. Neurologic: Grossly intact, no focal deficits, moving all 4 extremities. Psychiatric: Normal mood and affect.  Laboratory Data: Lab Results  Component Value Date   WBC 9.9 05/23/2016   HGB 13.5 05/23/2016   HCT 40.8 05/23/2016   MCV 89 05/23/2016   PLT 285 05/23/2016    Lab Results  Component Value Date   CREATININE 0.85 05/23/2016   I have reviewed the labs.  Pertinent Imaging: Results for PERSAIS, ETHRIDGE (MRN [id number]) as of 04/01/2017 08:40  Ref. Range  03/24/2017 09:25  Scan Result Unknown 24 ml     Assessment & Plan:    1. OAB  - patient is finding the Vesicare cost prohibitive - will have a trial of oxybutynin XL 15 mg daily - she will contact the office if she does not find it as effective as the Vesicare and/or she cannot tolerate the side effects  - RTC in 6 months for OAB questionnaire and PVR  2. Mixed incontinence  - see above  3. Left nephrolithiasis  - see is currently taking one potassium citrate 15 mEQ daily - she may discontinue the medication to see if her urine symptoms improve  - RTC in 6 months for KUB  - Advised to contact our office or seek treatment in the ED if becomes febrile or pain/ vomiting are difficult control in order to arrange for emergent/urgent intervention   Return in about 6 months (around 09/21/2017) for KUB, PVR and OAB questionnaire.  Dorance Spink, PA-C  [facility] [address] [place],  [address] [phone]

## 2017-03-24 ENCOUNTER — Ambulatory Visit (INDEPENDENT_AMBULATORY_CARE_PROVIDER_SITE_OTHER): Payer: 59 | Admitting: Urology

## 2017-03-24 ENCOUNTER — Encounter: Payer: Self-pay | Admitting: Urology

## 2017-03-24 VITALS — BP 147/87 | HR 86 | Ht 64.0 in | Wt 186.5 lb

## 2017-03-24 DIAGNOSIS — N200 Calculus of kidney: Secondary | ICD-10-CM

## 2017-03-24 DIAGNOSIS — N3281 Overactive bladder: Secondary | ICD-10-CM | POA: Diagnosis not present

## 2017-03-24 DIAGNOSIS — N3946 Mixed incontinence: Secondary | ICD-10-CM | POA: Diagnosis not present

## 2017-03-24 LAB — BLADDER SCAN AMB NON-IMAGING: Scan Result: 24

## 2017-03-24 MED ORDER — OXYBUTYNIN CHLORIDE ER 15 MG PO TB24: 15.0000 mg | ORAL_TABLET | Freq: Every day | ORAL | 0 refills | Status: DC

## 2017-03-31 ENCOUNTER — Other Ambulatory Visit: Payer: Self-pay | Admitting: Urology

## 2017-03-31 DIAGNOSIS — N200 Calculus of kidney: Secondary | ICD-10-CM

## 2017-04-28 ENCOUNTER — Other Ambulatory Visit: Payer: Self-pay

## 2017-04-28 MED ORDER — OXYBUTYNIN CHLORIDE ER 15 MG PO TB24: 15.0000 mg | ORAL_TABLET | Freq: Every day | ORAL | 1 refills | Status: DC

## 2017-05-04 ENCOUNTER — Telehealth: Payer: Self-pay | Admitting: Urology

## 2017-05-12 DIAGNOSIS — R972 Elevated prostate specific antigen [PSA]: Secondary | ICD-10-CM | POA: Diagnosis not present

## 2017-05-12 DIAGNOSIS — N200 Calculus of kidney: Secondary | ICD-10-CM | POA: Diagnosis not present

## 2017-05-12 NOTE — Telephone Encounter (Signed)
ERROR

## 2017-05-14 ENCOUNTER — Other Ambulatory Visit: Payer: Self-pay | Admitting: Urology

## 2017-05-19 ENCOUNTER — Other Ambulatory Visit: Payer: Self-pay | Admitting: Urology

## 2017-05-22 ENCOUNTER — Telehealth: Payer: Self-pay

## 2017-05-22 NOTE — Telephone Encounter (Signed)
Spoke with pt in reference to stone composition. Pt voiced understanding.

## 2017-05-22 NOTE — Telephone Encounter (Signed)
-----   Message from [name], PA-C sent at 05/21/2017  2:59 PM EST ----- Please let Teresa Moss know that her stone was composed of calcium oxalate.  She needs to increase water intake to 2.5 L daily, decrease protein intake, decrease sodium intake and avoid sodas.  There is also information on the [employer] on a low oxalate diet.  We could also pursue a 24 hour metabolic study for a more detailed report on her risk factors for forming stones.

## 2017-06-07 DIAGNOSIS — L578 Other skin changes due to chronic exposure to nonionizing radiation: Secondary | ICD-10-CM | POA: Diagnosis not present

## 2017-06-07 DIAGNOSIS — L853 Xerosis cutis: Secondary | ICD-10-CM | POA: Diagnosis not present

## 2017-06-08 DIAGNOSIS — Z0000 Encounter for general adult medical examination without abnormal findings: Secondary | ICD-10-CM | POA: Diagnosis not present

## 2017-06-08 DIAGNOSIS — E7800 Pure hypercholesterolemia, unspecified: Secondary | ICD-10-CM | POA: Diagnosis not present

## 2017-06-08 DIAGNOSIS — N200 Calculus of kidney: Secondary | ICD-10-CM | POA: Diagnosis not present

## 2017-06-13 DIAGNOSIS — Z0000 Encounter for general adult medical examination without abnormal findings: Secondary | ICD-10-CM | POA: Diagnosis not present

## 2017-06-13 DIAGNOSIS — E7800 Pure hypercholesterolemia, unspecified: Secondary | ICD-10-CM | POA: Diagnosis not present

## 2017-06-15 ENCOUNTER — Other Ambulatory Visit: Payer: Self-pay | Admitting: Urology

## 2017-06-15 DIAGNOSIS — N200 Calculus of kidney: Secondary | ICD-10-CM

## 2017-06-21 ENCOUNTER — Other Ambulatory Visit: Payer: Self-pay

## 2017-06-21 DIAGNOSIS — N200 Calculus of kidney: Secondary | ICD-10-CM

## 2017-06-21 MED ORDER — POTASSIUM CITRATE ER 15 MEQ (1620 MG) PO TBCR: 1.0000 | EXTENDED_RELEASE_TABLET | Freq: Every day | ORAL | 1 refills | Status: DC

## 2017-07-05 DIAGNOSIS — J069 Acute upper respiratory infection, unspecified: Secondary | ICD-10-CM | POA: Diagnosis not present

## 2017-07-13 DIAGNOSIS — J0100 Acute maxillary sinusitis, unspecified: Secondary | ICD-10-CM | POA: Diagnosis not present

## 2017-09-13 ENCOUNTER — Ambulatory Visit
Admission: RE | Admit: 2017-09-13 | Discharge: 2017-09-13 | Disposition: A | Discharge status: Home / Self Care | Payer: 59 | Source: Ambulatory Visit | Attending: Urology | Admitting: Urology

## 2017-09-13 DIAGNOSIS — N200 Calculus of kidney: Secondary | ICD-10-CM | POA: Insufficient documentation

## 2017-09-15 ENCOUNTER — Ambulatory Visit (INDEPENDENT_AMBULATORY_CARE_PROVIDER_SITE_OTHER): Payer: 59 | Admitting: Family Medicine

## 2017-09-15 ENCOUNTER — Ambulatory Visit: Payer: 59 | Admitting: Urology

## 2017-09-15 VITALS — BP 134/85 | HR 93 | Ht 64.0 in

## 2017-09-15 DIAGNOSIS — R109 Unspecified abdominal pain: Secondary | ICD-10-CM

## 2017-09-15 DIAGNOSIS — R102 Pelvic and perineal pain: Secondary | ICD-10-CM | POA: Diagnosis not present

## 2017-09-15 LAB — MICROSCOPIC EXAMINATION: WBC, UA: NONE SEEN /hpf (ref 0–5)

## 2017-09-15 LAB — URINALYSIS, COMPLETE
Bilirubin, UA: NEGATIVE
Glucose, UA: NEGATIVE
Ketones, UA: NEGATIVE
LEUKOCYTES UA: NEGATIVE
Nitrite, UA: NEGATIVE
PH UA: 6 (ref 5.0–7.5)
Protein, UA: NEGATIVE
SPEC GRAV UA: 1.02 (ref 1.005–1.030)
Urobilinogen, Ur: 0.2 mg/dL (ref 0.2–1.0)

## 2017-09-15 NOTE — Progress Notes (Signed)
Patient presents today with flank pain, bladder pressure. A urine was collected for UA, UCX. She did a KUB on Wednesday. Dr. Erlene Quan reviewed the X-Ray and there is no stone that is causing the discomfort. Her symptoms started 4 days ago. She has not been on ABX or had any urological surgeries in the last 30 days.

## 2017-09-19 LAB — CULTURE, URINE COMPREHENSIVE

## 2017-10-12 NOTE — [place] Notes (Signed)
10/13/2017 11:27 AM   Teresa Moss May 12, 1970 [id number]  Referring provider: Marinda Elk, MD [facility] [place], [place] [address]  Chief Complaint  Patient presents with  . Over Active Bladder    66month    HPI: Patient is a 47 year old Caucasian female with OAB, mixed incontinence and a history of nephrolithiasis who presents today for a 6 month follow up.    OAB Patient has been evaluated with UDS and cystoscopy.  No worrisome findings.  Her major complaints are frequency, urgency, nocturia and mixed incontinence.  Today, she is experiencing urgency x 0-3 (stable), frequency x 4-7 (stable), not restricting fluids to avoid visits to the restroom, is engaging in toilet mapping, incontinence x 0-3 (improved) and nocturia x 0-3 (stable).    Her PVR is 17 mL.  She did not find the oxybutynin helpful.  She would like to increase the Vesicare from 5 mg to 10 mg.    Mixed incontinence See above  History of nephrolithiasis Renal CT study performed in 12/2016 noted adrenal glands and right kidney are unremarkable. Punctate stones in the left kidney. Ureters are decompressed. Bladder is low in volume.  No recent flank pain or gross hematuria.  KUB in 09/2017 noted a 2 mm left renal stone.     PMH: Past Medical History:  Diagnosis Date  . Abnormal vaginal bleeding   . GERD (gastroesophageal reflux disease)   . HLD (hyperlipidemia)   . Kidney stone    Right    Surgical History: Past Surgical History:  Procedure Laterality Date  . CHOLECYSTECTOMY  2012    Home Medications:  Allergies as of 10/13/2017      Reactions   Codeine Other (See Comments)   Stimulation/paradoxical agitation   Hydrocodone Other (See Comments)   Paradoxical agitation   Macrobid [nitrofurantoin] Other (See Comments)   Profuse Sweating, Hot flashes, and tremors   Morphine Other (See Comments)   agitation   Penicillins Other (See Comments)   Unknown Reaction- Occurred  as a child   Promethazine Hcl Other (See Comments)      Medication List        Accurate as of 10/13/17 11:27 AM. Always use your most recent med list.          buPROPion 150 MG 24 hr tablet Commonly known as:  WELLBUTRIN XL   Cholecalciferol 1000 units tablet Take 1,000 Units daily by mouth.   levonorgestrel 20 MCG/24HR IUD Commonly known as:  MIRENA 1 each once by Intrauterine route.   lovastatin 40 MG tablet Commonly known as:  MEVACOR Take 40 mg at bedtime by mouth.   pantoprazole 40 MG tablet Commonly known as:  PROTONIX Take 40 mg daily by mouth.   Potassium Citrate 15 MEQ (1620 MG) Tbcr Take 1 tablet by mouth daily.   solifenacin 5 MG tablet Commonly known as:  VESICARE Take 1 tablet (5 mg total) by mouth daily.       Allergies:  Allergies  Allergen Reactions  . Codeine Other (See Comments)    Stimulation/paradoxical agitation  . Hydrocodone Other (See Comments)    Paradoxical agitation  . Macrobid [Nitrofurantoin] Other (See Comments)    Profuse Sweating, Hot flashes, and tremors  . Morphine Other (See Comments)    agitation  . Penicillins Other (See Comments)    Unknown Reaction- Occurred as a child  . Promethazine Hcl Other (See Comments)    Family History: Family History  Problem Relation  Age of Onset  . Heart disease Mother   . Heart disease Father   . Bladder Cancer Father   . Kidney disease Neg Hx   . Kidney cancer Neg Hx     Social History:  reports that she has been smoking cigarettes.  She has a 10.00 pack-year smoking history. She has never used smokeless tobacco. She reports that she drinks about 0.6 oz of alcohol per week. She reports that she does not use drugs.  ROS: UROLOGY Frequent Urination?: Yes Hard to postpone urination?: Yes Burning/pain with urination?: No Get up at night to urinate?: Yes Leakage of urine?: Yes Urine stream starts and stops?: No Trouble starting stream?: No Do you have to strain to urinate?:  No Blood in urine?: No Urinary tract infection?: No Sexually transmitted disease?: No Injury to kidneys or bladder?: No Painful intercourse?: No Weak stream?: No Currently pregnant?: No Vaginal bleeding?: No Last menstrual period?: n  Gastrointestinal Nausea?: No Vomiting?: No Indigestion/heartburn?: Yes Diarrhea?: No Constipation?: No  Constitutional Fever: No Night sweats?: No Weight loss?: No Fatigue?: No  Skin Skin rash/lesions?: No Itching?: No  Eyes Blurred vision?: No Double vision?: No  Ears/Nose/Throat Sore throat?: No Sinus problems?: No  Hematologic/Lymphatic Swollen glands?: No Easy bruising?: No  Cardiovascular Leg swelling?: No Chest pain?: No  Respiratory Cough?: No Shortness of breath?: No  Endocrine Excessive thirst?: No  Musculoskeletal Back pain?: No Joint pain?: No  Neurological Headaches?: No Dizziness?: No  Psychologic Depression?: No Anxiety?: No  Physical Exam: BP 133/75   Pulse 94   Ht 5\' 4"  (1.626 m)   Wt 190 lb (86.2 kg)   BMI 32.61 kg/m   Constitutional: Well nourished. Alert and oriented, No acute distress. HEENT: [place] AT, moist mucus membranes. Trachea midline, no masses. Cardiovascular: No clubbing, cyanosis, or edema. Respiratory: Normal respiratory effort, no increased work of breathing. Skin: No rashes, bruises or suspicious lesions. Lymph: No cervical or inguinal adenopathy. Neurologic: Grossly intact, no focal deficits, moving all 4 extremities. Psychiatric: Normal mood and affect.  Laboratory Data: Lab Results  Component Value Date   WBC 9.9 05/23/2016   HGB 13.5 05/23/2016   HCT 40.8 05/23/2016   MCV 89 05/23/2016   PLT 285 05/23/2016    Lab Results  Component Value Date   CREATININE 0.85 05/23/2016   I have reviewed the labs.  Pertinent Imaging: CLINICAL DATA:  BILATERAL nephrolithiasis  EXAM: ABDOMEN - 1 VIEW  COMPARISON:  CT abdomen pelvis 12/21/2016  FINDINGS: Surgical  clips RIGHT upper quadrant consistent with cholecystectomy.  IUD projects over pelvis.  Tiny nonobstructing LEFT renal calculus 2 mm diameter.  No additional urinary tract calcifications.  Small pelvic phleboliths.  Bowel gas pattern normal.  Bones unremarkable.  IMPRESSION: 2 mm nonobstructing LEFT renal calculus.   Electronically Signed   By: Lavonia Dana M.D.   On: 09/14/2017 10:09 I have independently reviewed the films   Assessment & Plan:    1. OAB Patient would like to increase the Vesicare to 10 mg - will give script RTC in 12 months for OAB questionnaire and PVR  2. Mixed incontinence  - see above  3. Left nephrolithiasis Joaquim Lai is stable RTC in one year for KUB Advised to contact our office or seek treatment in the ED if becomes febrile or pain/ vomiting are difficult control in order to arrange for emergent/urgent intervention   Return in about 1 year (around 10/14/2018) for KUB and office.  Zara Council, [facility]  [place],  [address] [phone]

## 2017-10-13 ENCOUNTER — Encounter: Payer: Self-pay | Admitting: Urology

## 2017-10-13 ENCOUNTER — Ambulatory Visit (INDEPENDENT_AMBULATORY_CARE_PROVIDER_SITE_OTHER): Payer: 59 | Admitting: Urology

## 2017-10-13 VITALS — BP 133/75 | HR 94 | Ht 64.0 in | Wt 190.0 lb

## 2017-10-13 DIAGNOSIS — N3281 Overactive bladder: Secondary | ICD-10-CM | POA: Diagnosis not present

## 2017-10-13 DIAGNOSIS — N200 Calculus of kidney: Secondary | ICD-10-CM

## 2017-10-13 DIAGNOSIS — N3946 Mixed incontinence: Secondary | ICD-10-CM | POA: Diagnosis not present

## 2017-10-13 LAB — BLADDER SCAN AMB NON-IMAGING

## 2017-10-13 MED ORDER — SOLIFENACIN SUCCINATE 10 MG PO TABS: 10.0000 mg | ORAL_TABLET | Freq: Every day | ORAL | 11 refills | Status: DC

## 2017-11-01 ENCOUNTER — Other Ambulatory Visit: Payer: Self-pay | Admitting: Obstetrics and Gynecology

## 2017-11-01 DIAGNOSIS — Z1211 Encounter for screening for malignant neoplasm of colon: Secondary | ICD-10-CM | POA: Diagnosis not present

## 2017-11-01 DIAGNOSIS — Z01419 Encounter for gynecological examination (general) (routine) without abnormal findings: Secondary | ICD-10-CM | POA: Diagnosis not present

## 2017-11-01 DIAGNOSIS — Z1231 Encounter for screening mammogram for malignant neoplasm of breast: Secondary | ICD-10-CM

## 2017-11-02 DIAGNOSIS — T8332XA Displacement of intrauterine contraceptive device, initial encounter: Secondary | ICD-10-CM | POA: Diagnosis not present

## 2017-11-30 ENCOUNTER — Ambulatory Visit
Admission: RE | Admit: 2017-11-30 | Discharge: 2017-11-30 | Disposition: A | Discharge status: Home / Self Care | Payer: 59 | Source: Ambulatory Visit | Attending: Obstetrics and Gynecology | Admitting: Obstetrics and Gynecology

## 2017-11-30 DIAGNOSIS — Z1231 Encounter for screening mammogram for malignant neoplasm of breast: Secondary | ICD-10-CM | POA: Insufficient documentation

## 2017-12-11 DIAGNOSIS — K219 Gastro-esophageal reflux disease without esophagitis: Secondary | ICD-10-CM | POA: Diagnosis not present

## 2017-12-11 DIAGNOSIS — R232 Flushing: Secondary | ICD-10-CM | POA: Diagnosis not present

## 2017-12-11 DIAGNOSIS — E7800 Pure hypercholesterolemia, unspecified: Secondary | ICD-10-CM | POA: Diagnosis not present

## 2017-12-18 DIAGNOSIS — M545 Low back pain: Secondary | ICD-10-CM | POA: Diagnosis not present

## 2017-12-18 DIAGNOSIS — M5442 Lumbago with sciatica, left side: Secondary | ICD-10-CM | POA: Diagnosis not present

## 2017-12-18 DIAGNOSIS — G5712 Meralgia paresthetica, left lower limb: Secondary | ICD-10-CM | POA: Diagnosis not present

## 2017-12-18 DIAGNOSIS — M79672 Pain in left foot: Secondary | ICD-10-CM | POA: Diagnosis not present

## 2017-12-26 DIAGNOSIS — Z0000 Encounter for general adult medical examination without abnormal findings: Secondary | ICD-10-CM | POA: Diagnosis not present

## 2018-01-19 DIAGNOSIS — L259 Unspecified contact dermatitis, unspecified cause: Secondary | ICD-10-CM | POA: Diagnosis not present

## 2018-01-26 DIAGNOSIS — Z23 Encounter for immunization: Secondary | ICD-10-CM | POA: Diagnosis not present

## 2018-03-28 DIAGNOSIS — J0100 Acute maxillary sinusitis, unspecified: Secondary | ICD-10-CM | POA: Diagnosis not present

## 2018-04-23 DIAGNOSIS — J0101 Acute recurrent maxillary sinusitis: Secondary | ICD-10-CM | POA: Diagnosis not present

## 2018-04-23 DIAGNOSIS — J9801 Acute bronchospasm: Secondary | ICD-10-CM | POA: Diagnosis not present

## 2018-04-26 DIAGNOSIS — R0789 Other chest pain: Secondary | ICD-10-CM | POA: Diagnosis not present

## 2018-04-26 DIAGNOSIS — J209 Acute bronchitis, unspecified: Secondary | ICD-10-CM | POA: Diagnosis not present

## 2018-05-18 ENCOUNTER — Telehealth: Payer: Self-pay | Admitting: Urology

## 2018-05-18 MED ORDER — SOLIFENACIN SUCCINATE 10 MG PO TABS: 10.0000 mg | ORAL_TABLET | Freq: Every day | ORAL | 11 refills | Status: DC

## 2018-05-18 NOTE — Telephone Encounter (Signed)
Pt needs a refill for Vesicare sent to [facility]

## 2018-06-12 DIAGNOSIS — Z1283 Encounter for screening for malignant neoplasm of skin: Secondary | ICD-10-CM | POA: Diagnosis not present

## 2018-06-12 DIAGNOSIS — L738 Other specified follicular disorders: Secondary | ICD-10-CM | POA: Diagnosis not present

## 2018-06-12 DIAGNOSIS — L578 Other skin changes due to chronic exposure to nonionizing radiation: Secondary | ICD-10-CM | POA: Diagnosis not present

## 2018-06-26 DIAGNOSIS — J069 Acute upper respiratory infection, unspecified: Secondary | ICD-10-CM | POA: Diagnosis not present

## 2018-07-04 ENCOUNTER — Other Ambulatory Visit: Payer: Self-pay | Admitting: Physician Assistant

## 2018-07-04 DIAGNOSIS — Z1231 Encounter for screening mammogram for malignant neoplasm of breast: Secondary | ICD-10-CM

## 2018-07-04 DIAGNOSIS — E7800 Pure hypercholesterolemia, unspecified: Secondary | ICD-10-CM | POA: Diagnosis not present

## 2018-07-04 DIAGNOSIS — Z0000 Encounter for general adult medical examination without abnormal findings: Secondary | ICD-10-CM | POA: Diagnosis not present

## 2018-07-04 DIAGNOSIS — E6609 Other obesity due to excess calories: Secondary | ICD-10-CM | POA: Diagnosis not present

## 2018-09-13 DIAGNOSIS — L237 Allergic contact dermatitis due to plants, except food: Secondary | ICD-10-CM | POA: Diagnosis not present

## 2018-10-18 NOTE — [place] Notes (Signed)
10/19/2018 1:49 PM   Teresa Moss 07/08/1970 [id number]  Referring provider: Marinda Elk, MD [facility] [place],  [place] [address]  Chief Complaint  Patient presents with  . Over Active Bladder    follow up  . Nephrolithiasis    HPI: Patient is a 48 year old Caucasian female with OAB, mixed incontinence and a history of nephrolithiasis who presents today for a 6 month follow up.    OAB Patient has been evaluated with UDS and cystoscopy.  No worrisome findings.  Her major complaints are frequency, urgency, nocturia and mixed incontinence.  Today, she is experiencing urgency x 0-3 (stable), frequency x 4-7 (stable), not restricting fluids to avoid visits to the restroom, is engaging in toilet mapping, incontinence x 0-3 (improved) and nocturia x 0-3 (stable).    Her PVR is 5 mL.  She did not find the oxybutynin helpful.  She is complaining of frequency, urgency, nocturia and incontinence.  She has been on Vesicare, but it has since went generic.  She is finding the generic medication intolerable as she feels it is causing her to have bladder spasms.   Mixed incontinence See above  History of nephrolithiasis Renal CT study performed in 12/2016 noted adrenal glands and right kidney are unremarkable. Punctate stones in the left kidney. Ureters are decompressed. Bladder is low in volume.  KUB in 09/2017 noted a 2 mm left renal stone.  KUB on 10/19/2018 notes a stable 2 mm left renal stone.  She does not report gross hematuria or flank pain.    PMH: Past Medical History:  Diagnosis Date  . Abnormal vaginal bleeding   . GERD (gastroesophageal reflux disease)   . HLD (hyperlipidemia)   . Kidney stone    Right    Surgical History: Past Surgical History:  Procedure Laterality Date  . CHOLECYSTECTOMY  2012    Home Medications:  Allergies as of 10/19/2018      Reactions   Codeine Other (See Comments)   Stimulation/paradoxical agitation    Hydrocodone Other (See Comments)   Paradoxical agitation   Macrobid [nitrofurantoin] Other (See Comments)   Profuse Sweating, Hot flashes, and tremors   Morphine Other (See Comments)   agitation   Penicillins Other (See Comments)   Unknown Reaction- Occurred as a child   Promethazine Hcl Other (See Comments)      Medication List       Accurate as of October 19, 2018  1:49 PM. If you have any questions, ask your nurse or doctor.        STOP taking these medications   pantoprazole 40 MG tablet Commonly known as: PROTONIX Stopped by: Castulo Scarpelli, PA-C   Potassium Citrate 15 MEQ (1620 MG) Tbcr Stopped by: Michaelah Credeur, PA-C   solifenacin 10 MG tablet Commonly known as: VESICARE Stopped by: Quita Mcgrory, PA-C   solifenacin 5 MG tablet Commonly known as: VESICARE Stopped by: Zara Council, PA-C     TAKE these medications   buPROPion 150 MG 24 hr tablet Commonly known as: WELLBUTRIN XL   Cholecalciferol 25 MCG (1000 UT) tablet Take 1,000 Units daily by mouth.   levonorgestrel 20 MCG/24HR IUD Commonly known as: MIRENA 1 each once by Intrauterine route.   lovastatin 40 MG tablet Commonly known as: MEVACOR Take 40 mg at bedtime by mouth.       Allergies:  Allergies  Allergen Reactions  . Codeine Other (See Comments)    Stimulation/paradoxical agitation  . Hydrocodone  Other (See Comments)    Paradoxical agitation  . Macrobid [Nitrofurantoin] Other (See Comments)    Profuse Sweating, Hot flashes, and tremors  . Morphine Other (See Comments)    agitation  . Penicillins Other (See Comments)    Unknown Reaction- Occurred as a child  . Promethazine Hcl Other (See Comments)    Family History: Family History  Problem Relation Age of Onset  . Heart disease Mother   . Heart disease Father   . Bladder Cancer Father   . Kidney disease Neg Hx   . Kidney cancer Neg Hx   . Breast cancer Neg Hx     Social History:  reports that she has been smoking  cigarettes. She has a 10.00 pack-year smoking history. She has never used smokeless tobacco. She reports current alcohol use of about 1.0 standard drinks of alcohol per week. She reports that she does not use drugs.  ROS: UROLOGY Frequent Urination?: Yes Hard to postpone urination?: Yes Burning/pain with urination?: No Get up at night to urinate?: Yes Leakage of urine?: Yes Urine stream starts and stops?: No Trouble starting stream?: No Do you have to strain to urinate?: No Blood in urine?: No Urinary tract infection?: No Sexually transmitted disease?: No Injury to kidneys or bladder?: No Painful intercourse?: No Weak stream?: No Currently pregnant?: No Vaginal bleeding?: No Last menstrual period?: n  Gastrointestinal Nausea?: No Vomiting?: No Indigestion/heartburn?: No Diarrhea?: No Constipation?: No  Constitutional Fever: No Night sweats?: No Weight loss?: No Fatigue?: No  Skin Skin rash/lesions?: No Itching?: No  Eyes Blurred vision?: No Double vision?: No  Ears/Nose/Throat Sore throat?: No Sinus problems?: No  Hematologic/Lymphatic Swollen glands?: No Easy bruising?: No  Cardiovascular Leg swelling?: No Chest pain?: No  Respiratory Cough?: No Shortness of breath?: No  Endocrine Excessive thirst?: No  Musculoskeletal Back pain?: No Joint pain?: No  Neurological Headaches?: No Dizziness?: No  Psychologic Depression?: No Anxiety?: No  Physical Exam: BP 137/84   Pulse 85   Ht 5\' 4"  (1.626 m)   BMI 32.61 kg/m   Constitutional:  Well nourished. Alert and oriented, No acute distress. HEENT: [place] AT, moist mucus membranes.  Trachea midline, no masses. Cardiovascular: No clubbing, cyanosis, or edema. Respiratory: Normal respiratory effort, no increased work of breathing. Neurologic: Grossly intact, no focal deficits, moving all 4 extremities. Psychiatric: Normal mood and affect.   Laboratory Data: Lab Results  Component Value Date    WBC 9.9 05/23/2016   HGB 13.5 05/23/2016   HCT 40.8 05/23/2016   MCV 89 05/23/2016   PLT 285 05/23/2016    Lab Results  Component Value Date   CREATININE 0.85 05/23/2016   I have reviewed the labs.  Pertinent Imaging: CLINICAL DATA:  Follow-up renal calculi  EXAM: ABDOMEN - 1 VIEW  COMPARISON:  09/13/2017  FINDINGS: Tiny nonobstructing lower pole renal stone is noted on the left. No other renal or ureteral calculi are seen. Phleboliths are noted within the pelvis. An IUD is noted in place. No bony abnormality is seen. The bowel gas pattern is within normal limits.  IMPRESSION: Stable tiny nonobstructing left renal stone.   Electronically Signed   By: Inez Catalina M.D.   On: 10/19/2018 13:54 I have independently reviewed the films and appreciate the left renal stone.    Assessment & Plan:    1. OAB I have given Mrs. Boller samples of Toviaz 8 mg daily, #28 to see if they are more tolerable as she is finding the generic Vesicare  intolerable, she will call the office when she finishes the samples to report her experience.  If they are tolerable and control her symptoms, we will prescribe her Toviaz.  If she finds Toviaz intolerable or it does not control her symptoms, we will prescribe the brand name Vesicare RTC in one year for OAB questionnaire and PVR  2. Mixed incontinence  - see above  3. Left nephrolithiasis Joaquim Lai is stable RTC in one year for KUB Advised to contact our office or seek treatment in the ED if becomes febrile or pain/ vomiting are difficult control in order to arrange for emergent/urgent intervention   Return in about 1 year (around 10/19/2019) for PVR, KUB and OAB questionnaire.  Zara Council, PA-C  [facility] [address] [place], [place] [address] [phone]

## 2018-10-19 ENCOUNTER — Encounter: Payer: Self-pay | Admitting: Urology

## 2018-10-19 ENCOUNTER — Other Ambulatory Visit: Payer: Self-pay

## 2018-10-19 ENCOUNTER — Ambulatory Visit
Admission: RE | Admit: 2018-10-19 | Discharge: 2018-10-19 | Disposition: A | Discharge status: Home / Self Care | Payer: 59 | Source: Ambulatory Visit | Attending: Urology | Admitting: Urology

## 2018-10-19 ENCOUNTER — Ambulatory Visit
Admission: RE | Admit: 2018-10-19 | Discharge: 2018-10-19 | Disposition: A | Discharge status: Home / Self Care | Payer: 59 | Attending: Urology | Admitting: Urology

## 2018-10-19 ENCOUNTER — Other Ambulatory Visit: Payer: Self-pay | Admitting: *Deleted

## 2018-10-19 ENCOUNTER — Ambulatory Visit: Payer: 59

## 2018-10-19 ENCOUNTER — Ambulatory Visit: Payer: 59 | Admitting: Urology

## 2018-10-19 VITALS — BP 137/84 | HR 85 | Ht 64.0 in

## 2018-10-19 DIAGNOSIS — N200 Calculus of kidney: Secondary | ICD-10-CM | POA: Diagnosis present

## 2018-10-19 DIAGNOSIS — N3946 Mixed incontinence: Secondary | ICD-10-CM

## 2018-10-19 DIAGNOSIS — R109 Unspecified abdominal pain: Secondary | ICD-10-CM | POA: Diagnosis not present

## 2018-10-19 DIAGNOSIS — N3281 Overactive bladder: Secondary | ICD-10-CM | POA: Diagnosis not present

## 2018-10-19 LAB — BLADDER SCAN AMB NON-IMAGING

## 2018-12-19 ENCOUNTER — Ambulatory Visit
Admission: RE | Admit: 2018-12-19 | Discharge: 2018-12-19 | Disposition: A | Discharge status: Home / Self Care | Payer: 59 | Source: Ambulatory Visit | Attending: Physician Assistant | Admitting: Physician Assistant

## 2018-12-19 DIAGNOSIS — Z1231 Encounter for screening mammogram for malignant neoplasm of breast: Secondary | ICD-10-CM | POA: Diagnosis present

## 2019-05-19 ENCOUNTER — Other Ambulatory Visit: Payer: Self-pay | Admitting: Urology

## 2019-07-01 ENCOUNTER — Other Ambulatory Visit
Admission: RE | Admit: 2019-07-01 | Discharge: 2019-07-01 | Disposition: A | Discharge status: Home / Self Care | Payer: 59 | Source: Ambulatory Visit | Attending: Physician Assistant | Admitting: Physician Assistant

## 2019-07-01 DIAGNOSIS — U071 COVID-19: Secondary | ICD-10-CM | POA: Diagnosis not present

## 2019-07-01 DIAGNOSIS — R0602 Shortness of breath: Secondary | ICD-10-CM | POA: Insufficient documentation

## 2019-07-01 LAB — FIBRIN DERIVATIVES D-DIMER (ARMC ONLY): Fibrin derivatives D-dimer (ARMC): 153.09 ng/mL (FEU) (ref 0.00–499.00)

## 2019-10-25 ENCOUNTER — Ambulatory Visit: Payer: 59 | Admitting: Urology

## 2019-10-30 NOTE — Progress Notes (Signed)
10/31/2019 10:56 AM   Earna Coder Collings 04-Jan-1971 [id number]  Referring provider: Marinda Elk, MD [facility] [place],  [place] [address]  Chief Complaint  Patient presents with   Nephrolithiasis    HPI: Patient is a 49 year old female with OAB, mixed incontinence and nephrolithiasis who presents today for a 12 month follow up.    OAB Patient has been evaluated with UDS and cystoscopy.  No worrisome findings.  Today, she is experiencing urgency x 0-3 (stable), frequency x 4-7 (stable), not restricting fluids to avoid visits to the restroom, is engaging in toilet mapping, incontinence x 0-3 (stable) and nocturia x 0-3 (stable).    Her PVR is 18 mL.  She did not find the oxybutynin helpful and the generic Vesicare is losing its effectiveness.  She is complaining of frequency, urgency and urge/stress incontinence.  Patient denies any modifying or aggravating factors.  Patient denies any gross hematuria, dysuria or suprapubic/flank pain.  Patient denies any fevers, chills, nausea or vomiting.   Mixed incontinence See above  History of nephrolithiasis Renal CT study performed in 12/2016 noted adrenal glands and right kidney are unremarkable. Punctate stones in the left kidney. Ureters are decompressed. Bladder is low in volume.  KUB in 09/2017 noted a 2 mm left renal stone.  KUB on 10/19/2018 notes a stable 2 mm left renal stone.  KUB on 10/31/2019 notes a stable 2 mm left lower pole renal stone.    PMH: Past Medical History:  Diagnosis Date   Abnormal vaginal bleeding    GERD (gastroesophageal reflux disease)    HLD (hyperlipidemia)    Kidney stone    Right    Surgical History: Past Surgical History:  Procedure Laterality Date   CHOLECYSTECTOMY  2012    Home Medications:  Allergies as of 10/31/2019      Reactions   Codeine Other (See Comments)   Stimulation/paradoxical agitation   Hydrocodone Other (See Comments)   Paradoxical  agitation   Macrobid [nitrofurantoin] Other (See Comments)   Profuse Sweating, Hot flashes, and tremors   Morphine Other (See Comments)   agitation   Penicillins Other (See Comments)   Unknown Reaction- Occurred as a child   Promethazine Hcl Other (See Comments)      Medication List       Accurate as of October 31, 2019 10:56 AM. If you have any questions, ask your nurse or doctor.        buPROPion 150 MG 24 hr tablet Commonly known as: WELLBUTRIN XL   Cholecalciferol 25 MCG (1000 UT) tablet Take 1,000 Units daily by mouth.   Gemtesa 75 MG Tabs Generic drug: Vibegron Take 75 mg by mouth daily. Started by: Zara Council, PA-C   levonorgestrel 20 MCG/24HR IUD Commonly known as: MIRENA 1 each once by Intrauterine route.   lovastatin 40 MG tablet Commonly known as: MEVACOR Take 40 mg at bedtime by mouth.   solifenacin 10 MG tablet Commonly known as: VESICARE TAKE 1 TABLET BY MOUTH  DAILY       Allergies:  Allergies  Allergen Reactions   Codeine Other (See Comments)    Stimulation/paradoxical agitation   Hydrocodone Other (See Comments)    Paradoxical agitation   Macrobid [Nitrofurantoin] Other (See Comments)    Profuse Sweating, Hot flashes, and tremors   Morphine Other (See Comments)    agitation   Penicillins Other (See Comments)    Unknown Reaction- Occurred as a child   Promethazine Hcl  Other (See Comments)    Family History: Family History  Problem Relation Age of Onset   Heart disease Mother    Heart disease Father    Bladder Cancer Father    Kidney disease Neg Hx    Kidney cancer Neg Hx    Breast cancer Neg Hx     Social History:  reports that she has been smoking cigarettes. She has a 10.00 pack-year smoking history. She has never used smokeless tobacco. She reports current alcohol use of about 1.0 standard drink of alcohol per week. She reports that she does not use drugs.  ROS: For pertinent review of systems please refer to  history of present illness  Physical Exam: BP 133/86    Pulse 93    Ht 5\' 4"  (1.626 m)    Wt 194 lb 8 oz (88.2 kg)    BMI 33.39 kg/m   Constitutional:  Well nourished. Alert and oriented, No acute distress. HEENT: [place] AT, mask in place.  Trachea midline Cardiovascular: No clubbing, cyanosis, or edema. Respiratory: Normal respiratory effort, no increased work of breathing. Neurologic: Grossly intact, no focal deficits, moving all 4 extremities. Psychiatric: Normal mood and affect. .   Laboratory Data: Lab Results  Component Value Date   WBC 9.9 05/23/2016   HGB 13.5 05/23/2016   HCT 40.8 05/23/2016   MCV 89 05/23/2016   PLT 285 05/23/2016    Lab Results  Component Value Date   CREATININE 0.85 05/23/2016   I have reviewed the labs.  Pertinent Imaging: CLINICAL DATA:  Nephrolithiasis  EXAM: ABDOMEN - 1 VIEW  COMPARISON:  10/19/2018  FINDINGS: The bowel gas pattern is normal. Stable tiny calcification projecting over the lower pole the left kidney. Unchanged pelvic phleboliths. Cholecystectomy clips. IUD noted.  IMPRESSION: Stable tiny calcification projecting over the lower pole the left kidney.   Electronically Signed   By: Davina Poke D.O.   On: 11/01/2019 13:00 I have independently reviewed the films.  See HPI.   Assessment & Plan:    1. OAB Will have a trial of Gemtesa 75 mg daily, # 28 samples given RTC in three weeks for OAB questionnaire and PVR  2. Mixed incontinence  - see above  3. Left nephrolithiasis Stone is stable Stone has been stable x 3 KUB's No further imaging at this time We will treat clinically   Return in about 3 weeks (around 11/21/2019) for PVR and OAB questionnaire.  Zara Council, PA-C  [facility] [address] [place], [place] [address] [phone]

## 2019-10-31 ENCOUNTER — Ambulatory Visit
Admission: RE | Admit: 2019-10-31 | Discharge: 2019-10-31 | Disposition: A | Discharge status: Home / Self Care | Payer: 59 | Source: Ambulatory Visit | Attending: Urology | Admitting: Urology

## 2019-10-31 ENCOUNTER — Encounter: Payer: Self-pay | Admitting: Urology

## 2019-10-31 ENCOUNTER — Other Ambulatory Visit: Payer: Self-pay

## 2019-10-31 ENCOUNTER — Ambulatory Visit: Payer: 59 | Admitting: Urology

## 2019-10-31 ENCOUNTER — Other Ambulatory Visit: Payer: Self-pay | Admitting: Family Medicine

## 2019-10-31 VITALS — BP 133/86 | HR 93 | Ht 64.0 in | Wt 194.5 lb

## 2019-10-31 DIAGNOSIS — N200 Calculus of kidney: Secondary | ICD-10-CM | POA: Insufficient documentation

## 2019-10-31 DIAGNOSIS — N3946 Mixed incontinence: Secondary | ICD-10-CM

## 2019-10-31 DIAGNOSIS — N3281 Overactive bladder: Secondary | ICD-10-CM

## 2019-10-31 LAB — BLADDER SCAN AMB NON-IMAGING: Scan Result: 18

## 2019-10-31 MED ORDER — GEMTESA 75 MG PO TABS: 75.0000 mg | ORAL_TABLET | Freq: Every day | ORAL | 0 refills | Status: DC

## 2019-11-19 ENCOUNTER — Other Ambulatory Visit: Payer: Self-pay | Admitting: Physician Assistant

## 2019-11-19 DIAGNOSIS — Z1231 Encounter for screening mammogram for malignant neoplasm of breast: Secondary | ICD-10-CM

## 2019-11-21 NOTE — Progress Notes (Signed)
11/22/2019 9:19 AM   Teresa Moss [birth year] [id number]  Referring provider: Marinda Elk, MD [facility] [place],  [place] [address]  Chief Complaint  Patient presents with  . Over Active Bladder    HPI: Patient is a 49 year old female with OAB, mixed incontinence and nephrolithiasis who presents today for a 3 week trial of Gemtesa.      OAB Patient has been evaluated with UDS and cystoscopy.  No worrisome findings.  Today, she is experiencing urgency x 0-3 (stable), frequency x 8 or more (worse), not restricting fluids to avoid visits to the restroom, is engaging in toilet mapping, incontinence x 0-3 (stable) and nocturia x 0-3 (stable).    BP is 146/86 .   Her PVR is 0 mL.  She did not find the oxybutynin helpful and the generic Vesicare is losing its effectiveness.  Gemtesa caused the sweats.  She has gone bad to taking her Vesicare and she states that this will work for her at this time.      Mixed incontinence See above  History of nephrolithiasis Renal CT study performed in 12/2016 noted adrenal glands and right kidney are unremarkable. Punctate stones in the left kidney. Ureters are decompressed. Bladder is low in volume.  KUB in 09/2017 noted a 2 mm left renal stone.  KUB on 10/19/2018 notes a stable 2 mm left renal stone.  KUB on 10/31/2019 notes a stable 2 mm left lower pole renal stone.    PMH: Past Medical History:  Diagnosis Date  . Abnormal vaginal bleeding   . GERD (gastroesophageal reflux disease)   . HLD (hyperlipidemia)   . Kidney stone    Right    Surgical History: Past Surgical History:  Procedure Laterality Date  . CHOLECYSTECTOMY  2012    Home Medications:  Allergies as of 11/22/2019      Reactions   Codeine Other (See Comments)   Stimulation/paradoxical agitation   Hydrocodone Other (See Comments)   Paradoxical agitation   Macrobid [nitrofurantoin] Other (See Comments)   Profuse Sweating, Hot flashes, and  tremors   Morphine Other (See Comments)   agitation   Penicillins Other (See Comments)   Unknown Reaction- Occurred as a child   Promethazine Hcl Other (See Comments)      Medication List       Accurate as of November 22, 2019  9:19 AM. If you have any questions, ask your nurse or doctor.        STOP taking these medications   Gemtesa 75 MG Tabs Generic drug: Vibegron Stopped by: Zara Council, PA-C     TAKE these medications   buPROPion 150 MG 24 hr tablet Commonly known as: WELLBUTRIN XL   Cholecalciferol 25 MCG (1000 UT) tablet Take 1,000 Units daily by mouth.   levonorgestrel 20 MCG/24HR IUD Commonly known as: MIRENA 1 each once by Intrauterine route.   lovastatin 40 MG tablet Commonly known as: MEVACOR Take 40 mg at bedtime by mouth.   pantoprazole 40 MG tablet Commonly known as: PROTONIX Take 40 mg by mouth daily.   solifenacin 10 MG tablet Commonly known as: VESICARE TAKE 1 TABLET BY MOUTH  DAILY       Allergies:  Allergies  Allergen Reactions  . Codeine Other (See Comments)    Stimulation/paradoxical agitation  . Hydrocodone Other (See Comments)    Paradoxical agitation  . Macrobid [Nitrofurantoin] Other (See Comments)    Profuse Sweating, Hot flashes, and tremors  .  Morphine Other (See Comments)    agitation  . Penicillins Other (See Comments)    Unknown Reaction- Occurred as a child  . Promethazine Hcl Other (See Comments)    Family History: Family History  Problem Relation Age of Onset  . Heart disease Mother   . Heart disease Father   . Bladder Cancer Father   . Kidney disease Neg Hx   . Kidney cancer Neg Hx   . Breast cancer Neg Hx     Social History:  reports that she has been smoking cigarettes. She has a 10.00 pack-year smoking history. She has never used smokeless tobacco. She reports current alcohol use of about 1.0 standard drink of alcohol per week. She reports that she does not use drugs.  ROS: For pertinent review of  systems please refer to history of present illness  Physical Exam: BP (!) 146/86   Pulse 86   Ht 5\' 4"  (1.626 m)   Wt 190 lb (86.2 kg)   BMI 32.61 kg/m   Constitutional:  Well nourished. Alert and oriented, No acute distress. HEENT: [place] AT, mask in place.  Trachea midline Cardiovascular: No clubbing, cyanosis, or edema. Respiratory: Normal respiratory effort, no increased work of breathing. Neurologic: Grossly intact, no focal deficits, moving all 4 extremities. Psychiatric: Normal mood and affect.   Laboratory Data: Lab Results  Component Value Date   WBC 9.9 05/23/2016   HGB 13.5 05/23/2016   HCT 40.8 05/23/2016   MCV 89 05/23/2016   PLT 285 05/23/2016    Lab Results  Component Value Date   CREATININE 0.85 05/23/2016   I have reviewed the labs.  Pertinent Imaging: CLINICAL DATA:  Nephrolithiasis  EXAM: ABDOMEN - 1 VIEW  COMPARISON:  10/19/2018  FINDINGS: The bowel gas pattern is normal. Stable tiny calcification projecting over the lower pole the left kidney. Unchanged pelvic phleboliths. Cholecystectomy clips. IUD noted.  IMPRESSION: Stable tiny calcification projecting over the lower pole the left kidney.   Electronically Signed   By: Davina Poke D.O.   On: 11/01/2019 13:00 I have independently reviewed the films.  See HPI.   Assessment & Plan:    1. OAB Gemtesa-- intolerable side effects Will continue the Gemtesa Discussed the next steps in the algorithm of PTNS and/or Botox-I have given her brochures regarding both treatments and she will contact us if she is interested in pursuing either She will follow-up in 1 year for OAB questionnaire and PVR 2. Mixed incontinence  - see above   Return in about 1 year (around 11/21/2020) for PVR and OAB questionnaire.  Zara Council, PA-C  [facility] [address] [place], [place] [address] [phone]

## 2019-11-22 ENCOUNTER — Other Ambulatory Visit: Payer: Self-pay

## 2019-11-22 ENCOUNTER — Encounter: Payer: Self-pay | Admitting: Urology

## 2019-11-22 ENCOUNTER — Ambulatory Visit: Payer: 59 | Admitting: Urology

## 2019-11-22 VITALS — BP 146/86 | HR 86 | Ht 64.0 in | Wt 190.0 lb

## 2019-11-22 DIAGNOSIS — N3946 Mixed incontinence: Secondary | ICD-10-CM

## 2019-11-22 DIAGNOSIS — N3281 Overactive bladder: Secondary | ICD-10-CM | POA: Diagnosis not present

## 2019-11-22 LAB — BLADDER SCAN AMB NON-IMAGING: Scan Result: 0

## 2019-12-09 ENCOUNTER — Other Ambulatory Visit: Payer: Self-pay

## 2019-12-09 ENCOUNTER — Encounter: Payer: Self-pay | Admitting: Emergency Medicine

## 2019-12-09 ENCOUNTER — Emergency Department
Admission: EM | Admit: 2019-12-09 | Discharge: 2019-12-09 | Disposition: A | Discharge status: Home / Self Care | Payer: 59 | Attending: Emergency Medicine | Admitting: Emergency Medicine

## 2019-12-09 ENCOUNTER — Emergency Department: Payer: 59

## 2019-12-09 DIAGNOSIS — R42 Dizziness and giddiness: Secondary | ICD-10-CM | POA: Insufficient documentation

## 2019-12-09 DIAGNOSIS — Z5321 Procedure and treatment not carried out due to patient leaving prior to being seen by health care provider: Secondary | ICD-10-CM | POA: Insufficient documentation

## 2019-12-09 DIAGNOSIS — R0602 Shortness of breath: Secondary | ICD-10-CM | POA: Insufficient documentation

## 2019-12-09 LAB — CBC
HCT: 44.8 % (ref 36.0–46.0)
Hemoglobin: 15.5 g/dL — ABNORMAL HIGH (ref 12.0–15.0)
MCH: 29.9 pg (ref 26.0–34.0)
MCHC: 34.6 g/dL (ref 30.0–36.0)
MCV: 86.3 fL (ref 80.0–100.0)
Platelets: 322 10*3/uL (ref 150–400)
RBC: 5.19 MIL/uL — ABNORMAL HIGH (ref 3.87–5.11)
RDW: 13.2 % (ref 11.5–15.5)
WBC: 7.3 10*3/uL (ref 4.0–10.5)
nRBC: 0 % (ref 0.0–0.2)

## 2019-12-09 LAB — BASIC METABOLIC PANEL
Anion gap: 5 (ref 5–15)
BUN: 14 mg/dL (ref 6–20)
CO2: 28 mmol/L (ref 22–32)
Calcium: 9.1 mg/dL (ref 8.9–10.3)
Chloride: 104 mmol/L (ref 98–111)
Creatinine, Ser: 0.99 mg/dL (ref 0.44–1.00)
GFR calc Af Amer: >60 mL/min (ref >60)
GFR calc non Af Amer: >60 mL/min (ref >60)
Glucose, Bld: 98 mg/dL (ref 70–99)
Potassium: 4.4 mmol/L (ref 3.5–5.1)
Sodium: 137 mmol/L (ref 135–145)

## 2019-12-09 LAB — TROPONIN I (HIGH SENSITIVITY): Troponin I (High Sensitivity): 3 ng/L (ref <18)

## 2019-12-09 NOTE — ED Notes (Signed)
Pt called x's 3, no response ?

## 2019-12-09 NOTE — ED Triage Notes (Signed)
Pt reports this am she was sitting at her desk and suddenly got SOB and had a spell of dizziness. Pt reports episode lasted 5 minutes and then she was fine. Pt denies all other sx's. Pt reports still feels SOB.

## 2019-12-10 ENCOUNTER — Other Ambulatory Visit
Admission: RE | Admit: 2019-12-10 | Discharge: 2019-12-10 | Disposition: A | Discharge status: Home / Self Care | Payer: 59 | Source: Ambulatory Visit | Attending: Physician Assistant | Admitting: Physician Assistant

## 2019-12-10 ENCOUNTER — Other Ambulatory Visit: Payer: Self-pay | Admitting: Physician Assistant

## 2019-12-10 ENCOUNTER — Ambulatory Visit
Admission: RE | Admit: 2019-12-10 | Discharge: 2019-12-10 | Disposition: A | Discharge status: Home / Self Care | Payer: 59 | Source: Ambulatory Visit | Attending: Physician Assistant | Admitting: Physician Assistant

## 2019-12-10 DIAGNOSIS — R42 Dizziness and giddiness: Secondary | ICD-10-CM | POA: Insufficient documentation

## 2019-12-10 DIAGNOSIS — R55 Syncope and collapse: Secondary | ICD-10-CM | POA: Insufficient documentation

## 2019-12-10 DIAGNOSIS — R0602 Shortness of breath: Secondary | ICD-10-CM | POA: Insufficient documentation

## 2019-12-10 LAB — FIBRIN DERIVATIVES D-DIMER (ARMC ONLY): Fibrin derivatives D-dimer (ARMC): 170.2 ng/mL (FEU) (ref 0.00–499.00)

## 2019-12-10 MED ORDER — IOHEXOL 350 MG/ML SOLN
75.0000 mL | Freq: Once | INTRAVENOUS | Status: AC | PRN
  Administered 2019-12-10: 75 mL via INTRAVENOUS

## 2019-12-20 ENCOUNTER — Ambulatory Visit
Admission: RE | Admit: 2019-12-20 | Discharge: 2019-12-20 | Disposition: A | Discharge status: Home / Self Care | Payer: 59 | Source: Ambulatory Visit | Attending: Physician Assistant | Admitting: Physician Assistant

## 2019-12-20 ENCOUNTER — Other Ambulatory Visit: Payer: Self-pay

## 2019-12-20 DIAGNOSIS — Z1231 Encounter for screening mammogram for malignant neoplasm of breast: Secondary | ICD-10-CM | POA: Diagnosis present

## 2020-04-13 ENCOUNTER — Other Ambulatory Visit: Payer: Self-pay | Admitting: Urology

## 2020-07-18 ENCOUNTER — Emergency Department: Payer: 59

## 2020-07-18 ENCOUNTER — Encounter: Payer: Self-pay | Admitting: Emergency Medicine

## 2020-07-18 ENCOUNTER — Other Ambulatory Visit: Payer: Self-pay

## 2020-07-18 ENCOUNTER — Emergency Department
Admission: EM | Admit: 2020-07-18 | Discharge: 2020-07-18 | Disposition: A | Discharge status: Home / Self Care | Payer: 59 | Attending: Emergency Medicine | Admitting: Emergency Medicine

## 2020-07-18 DIAGNOSIS — K219 Gastro-esophageal reflux disease without esophagitis: Secondary | ICD-10-CM | POA: Insufficient documentation

## 2020-07-18 DIAGNOSIS — R109 Unspecified abdominal pain: Secondary | ICD-10-CM | POA: Insufficient documentation

## 2020-07-18 DIAGNOSIS — R102 Pelvic and perineal pain: Secondary | ICD-10-CM | POA: Diagnosis not present

## 2020-07-18 DIAGNOSIS — F17210 Nicotine dependence, cigarettes, uncomplicated: Secondary | ICD-10-CM | POA: Insufficient documentation

## 2020-07-18 HISTORY — DX: Calculus of kidney: N20.0

## 2020-07-18 LAB — URINALYSIS, COMPLETE (UACMP) WITH MICROSCOPIC
Bilirubin Urine: NEGATIVE
Glucose, UA: NEGATIVE mg/dL
Ketones, ur: NEGATIVE mg/dL
Leukocytes,Ua: NEGATIVE
Nitrite: NEGATIVE
Protein, ur: NEGATIVE mg/dL
Specific Gravity, Urine: 1.021 (ref 1.005–1.030)
pH: 5 (ref 5.0–8.0)

## 2020-07-18 LAB — BASIC METABOLIC PANEL
Anion gap: 6 (ref 5–15)
BUN: 19 mg/dL (ref 6–20)
CO2: 26 mmol/L (ref 22–32)
Calcium: 9.3 mg/dL (ref 8.9–10.3)
Chloride: 106 mmol/L (ref 98–111)
Creatinine, Ser: 1.47 mg/dL — ABNORMAL HIGH (ref 0.44–1.00)
GFR, Estimated: 43 mL/min — ABNORMAL LOW (ref >60)
Glucose, Bld: 131 mg/dL — ABNORMAL HIGH (ref 70–99)
Potassium: 4.5 mmol/L (ref 3.5–5.1)
Sodium: 138 mmol/L (ref 135–145)

## 2020-07-18 LAB — CBC
HCT: 45.7 % (ref 36.0–46.0)
Hemoglobin: 15.2 g/dL — ABNORMAL HIGH (ref 12.0–15.0)
MCH: 29.5 pg (ref 26.0–34.0)
MCHC: 33.3 g/dL (ref 30.0–36.0)
MCV: 88.6 fL (ref 80.0–100.0)
Platelets: 327 10*3/uL (ref 150–400)
RBC: 5.16 MIL/uL — ABNORMAL HIGH (ref 3.87–5.11)
RDW: 13 % (ref 11.5–15.5)
WBC: 13.7 10*3/uL — ABNORMAL HIGH (ref 4.0–10.5)
nRBC: 0 % (ref 0.0–0.2)

## 2020-07-18 LAB — LIPASE, BLOOD: Lipase: 37 U/L (ref 11–51)

## 2020-07-18 LAB — POC URINE PREG, ED: Preg Test, Ur: NEGATIVE

## 2020-07-18 MED ORDER — OXYCODONE-ACETAMINOPHEN 5-325 MG PO TABS
1.0000 | ORAL_TABLET | Freq: Once | ORAL | Status: AC
  Administered 2020-07-18: 1 via ORAL
  Filled 2020-07-18: qty 1

## 2020-07-18 MED ORDER — ONDANSETRON 4 MG PO TBDP: 4.0000 mg | ORAL_TABLET | Freq: Three times a day (TID) | ORAL | 0 refills | Status: AC | PRN

## 2020-07-18 MED ORDER — ONDANSETRON 4 MG PO TBDP
4.0000 mg | ORAL_TABLET | Freq: Once | ORAL | Status: AC
  Administered 2020-07-18: 4 mg via ORAL
  Filled 2020-07-18: qty 1

## 2020-07-18 MED ORDER — OXYCODONE-ACETAMINOPHEN 5-325 MG PO TABS: 1.0000 | ORAL_TABLET | Freq: Four times a day (QID) | ORAL | 0 refills | Status: AC | PRN

## 2020-07-18 NOTE — ED Provider Notes (Signed)
[facility]-EMERGENCY DEPARTMENT  ____________________________________________  Time seen: Approximately 7:36 PM  I have reviewed the triage vital signs and the nursing notes.   HISTORY  Chief Complaint Flank Pain   Historian Patient    HPI Teresa Moss is a 50 y.o. female presents to the emergency department with left-sided flank pain and right and left-sided lower pelvic discomfort.  Patient denies dysuria, hematuria or increased urinary frequency.  Patient states that she has an extensive history of nephrolithiasis with lithotripsy and her current symptoms feel similar to nephrolithiasis pain she has experienced in the past.  She denies fever and chills.  She denies falls or mechanisms of trauma.  Patient states that she has had some nausea but no vomiting.  Denies a history of pyelonephritis.  Patient was seen and evaluated by urgent care earlier this morning and had a KUB which could not identify a kidney stone.  She was sent home on Cipro and Flomax.   Past Medical History:  Diagnosis Date  . Abnormal vaginal bleeding   . GERD (gastroesophageal reflux disease)   . HLD (hyperlipidemia)   . Kidney stone    Right  . Kidney stones      Immunizations up to date:  Yes.     Past Medical History:  Diagnosis Date  . Abnormal vaginal bleeding   . GERD (gastroesophageal reflux disease)   . HLD (hyperlipidemia)   . Kidney stone    Right  . Kidney stones     Patient Active Problem List   Diagnosis Date Noted  . Kidney stones 09/17/2015  . History of hematuria 09/17/2015  . Anxiety 03/03/2015  . Clinical depression 03/03/2015  . Hypercholesterolemia 03/03/2015  . Calculus of kidney 03/03/2015  . Adiposity 03/03/2015  . Acute inflammation of the pancreas 03/03/2015  . Avitaminosis D 03/03/2015  . Pure hypercholesterolemia 07/18/2013    Past Surgical History:  Procedure Laterality Date  . CHOLECYSTECTOMY  2012    Prior to Admission medications   Medication Sig Start  Date End Date Taking? Authorizing Provider  ondansetron (ZOFRAN ODT) 4 MG disintegrating tablet Take 1 tablet (4 mg total) by mouth every 8 (eight) hours as needed for up to 5 days. 07/18/20 07/23/20 Yes Vallarie Mare M, PA-C  oxyCODONE-acetaminophen (PERCOCET/ROXICET) 5-325 MG tablet Take 1 tablet by mouth every 6 (six) hours as needed for up to 3 days. 07/18/20 07/21/20 Yes Vallarie Mare M, PA-C  buPROPion (WELLBUTRIN XL) 150 MG 24 hr tablet  08/23/17   [provider]  Cholecalciferol 1000 UNITS tablet Take 1,000 Units daily by mouth.     [provider]  levonorgestrel (MIRENA) 20 MCG/24HR IUD 1 each once by Intrauterine route.  10/29/15   [provider]  lovastatin (MEVACOR) 40 MG tablet Take 40 mg at bedtime by mouth.  05/27/14   [provider]  pantoprazole (PROTONIX) 40 MG tablet Take 40 mg by mouth daily. 10/30/19   [provider]  solifenacin (VESICARE) 10 MG tablet TAKE 1 TABLET BY MOUTH  DAILY 04/13/20   Zara Council A, PA-C    Allergies Codeine, Hydrocodone, Macrobid [nitrofurantoin], Morphine, Penicillins, and Promethazine hcl  Family History  Problem Relation Age of Onset  . Heart disease Mother   . Heart disease Father   . Bladder Cancer Father   . Kidney disease Neg Hx   . Kidney cancer Neg Hx   . Breast cancer Neg Hx     Social History Social History   Tobacco Use  . Smoking  status: Heavy Tobacco Smoker    Packs/day: 0.50    Years: 20.00    Pack years: 10.00    Types: Cigarettes  . Smokeless tobacco: Never Used  . Tobacco comment: socially  Substance Use Topics  . Alcohol use: Yes    Alcohol/week: 1.0 standard drink    Types: 1 Standard drinks or equivalent per week    Comment: occ  . Drug use: No     Review of Systems  Constitutional: No fever/chills Eyes:  No discharge ENT: No upper respiratory complaints. Respiratory: no cough. No SOB/ use of accessory muscles to breath Gastrointestinal:   No nausea, no  vomiting.  No diarrhea.  No constipation. Genitourinary: Patient has left sided flank pain.  Musculoskeletal: Negative for musculoskeletal pain. Skin: Negative for rash, abrasions, lacerations, ecchymosis.    ____________________________________________   PHYSICAL EXAM:  VITAL SIGNS: ED Triage Vitals  Enc Vitals Group     BP 07/18/20 1747 (!) 160/116     Pulse Rate 07/18/20 1747 99     Resp 07/18/20 1747 20     Temp 07/18/20 1747 98.2 F (36.8 C)     Temp Source 07/18/20 1747 Oral     SpO2 07/18/20 1747 97 %     Weight 07/18/20 1744 197 lb (89.4 kg)     Height 07/18/20 1744 5\' 4"  (1.626 m)     Head Circumference --      Peak Flow --      Pain Score 07/18/20 1744 6     Pain Loc --      Pain Edu? --      Excl. in [place]? --      Constitutional: Alert and oriented. Well appearing and in no acute distress. Eyes: Conjunctivae are normal. PERRL. EOMI. Head: Atraumatic. ENT:      Ears:       Nose: No congestion/rhinnorhea.      Mouth/Throat: Mucous membranes are moist.  Neck: No stridor.  No cervical spine tenderness to palpation. Cardiovascular: Normal rate, regular rhythm. Normal S1 and S2.  Good peripheral circulation. Respiratory: Normal respiratory effort without tachypnea or retractions. Lungs CTAB. Good air entry to the bases with no decreased or absent breath sounds Gastrointestinal: Bowel sounds x 4 quadrants. Soft and nontender to palpation. No guarding or rigidity. No distention. Genitourinary: Patient has left sided CVA tenderness.  Musculoskeletal: Full range of motion to all extremities. No obvious deformities noted Neurologic:  Normal for age. No gross focal neurologic deficits are appreciated.  Skin:  Skin is warm, dry and intact. No rash noted. Psychiatric: Mood and affect are normal for age. Speech and behavior are normal.   ____________________________________________   LABS (all labs ordered are listed, but only abnormal results are displayed)  Labs  Reviewed  BASIC METABOLIC PANEL - Abnormal; Notable for the following components:      Result Value   Glucose, Bld 131 (*)    Creatinine, Ser 1.47 (*)    GFR, Estimated 43 (*)    All other components within normal limits  CBC - Abnormal; Notable for the following components:   WBC 13.7 (*)    RBC 5.16 (*)    Hemoglobin 15.2 (*)    All other components within normal limits  URINALYSIS, COMPLETE (UACMP) WITH MICROSCOPIC - Abnormal; Notable for the following components:   Color, Urine YELLOW (*)    APPearance HAZY (*)    Hgb urine dipstick MODERATE (*)    Bacteria, UA FEW (*)    All  other components within normal limits  URINE CULTURE  LIPASE, BLOOD  POC URINE PREG, ED   ____________________________________________  EKG   ____________________________________________  RADIOLOGY Unk Pinto, personally viewed and evaluated these images (plain radiographs) as part of my medical decision making, as well as reviewing the written report by the radiologist.     CT Renal Stone Study  Result Date: 07/18/2020 CLINICAL DATA:  Left flank pain. EXAM: CT ABDOMEN AND PELVIS WITHOUT CONTRAST TECHNIQUE: Multidetector CT imaging of the abdomen and pelvis was performed following the standard protocol without IV contrast. COMPARISON:  December 21, 2016 FINDINGS: Lower chest: No acute abnormality. Hepatobiliary: No focal liver abnormality is seen. No gallstones, gallbladder wall thickening, or biliary dilatation. Pancreas: Unremarkable. No pancreatic ductal dilatation or surrounding inflammatory changes. Spleen: Normal in size without focal abnormality. Adrenals/Urinary Tract: Adrenal glands are unremarkable. Kidneys are normal in size, without focal lesions. 1 mm nonobstructing renal calculi are seen within both kidneys. A 3 mm obstructing renal stone is seen at the left UVJ, with mild left-sided hydronephrosis, hydroureter and perinephric inflammatory fat stranding. Bladder is unremarkable.  Stomach/Bowel: Stomach is within normal limits. The appendix is not clearly identified. No evidence of bowel wall thickening, distention, or inflammatory changes. Noninflamed diverticula are seen throughout the sigmoid colon. Vascular/Lymphatic: No significant vascular findings are present. No enlarged abdominal or pelvic lymph nodes. Reproductive: An IUD is seen within an otherwise normal appearing uterus. Other: No abdominal wall hernia or abnormality. No abdominopelvic ascites. Musculoskeletal: Degenerative changes are seen at the level of L5-S1. IMPRESSION: 1. 3 mm obstructing renal stone at the left UVJ. 2. Bilateral 1 mm nonobstructing renal calculi. 3. Sigmoid diverticulosis. Electronically Signed   By: Virgina Norfolk M.D.   On: 07/18/2020 19:59    ____________________________________________    PROCEDURES  Procedure(s) performed:     Procedures     Medications  oxyCODONE-acetaminophen (PERCOCET/ROXICET) 5-325 MG per tablet 1 tablet (1 tablet Oral Given 07/18/20 1920)  ondansetron (ZOFRAN-ODT) disintegrating tablet 4 mg (4 mg Oral Given 07/18/20 1919)     ____________________________________________   INITIAL IMPRESSION / ASSESSMENT AND PLAN / ED COURSE  Pertinent labs & imaging results that were available during my care of the patient were reviewed by me and considered in my medical decision making (see chart for details).      Assessment and Plan:  Flank pain 50 year old female presents to the emergency department with persistent left-sided flank pain and concern for nephrolithiasis.  Patient was hypertensive at triage but vital signs were otherwise reassuring.  Patient had elevated white blood cell count at 13.7 and mild increase in creatinine from her baseline at 1.47.  She had a moderate amount of blood and a few bacteria in her urine.  CT renal stone study showed a 3 mm obstructing stone at the UVJ.  Recommended continuing Flomax and Cipro as directed by urgent  care.  Patient was also prescribed a short course of Percocet for pain.  She was advised to follow-up with urology as needed.  Return precautions were given to return with new or worsening symptoms.    ____________________________________________  FINAL CLINICAL IMPRESSION(S) / ED DIAGNOSES  Final diagnoses:  Flank pain      NEW MEDICATIONS STARTED DURING THIS VISIT:  ED Discharge Orders         Ordered    oxyCODONE-acetaminophen (PERCOCET/ROXICET) 5-325 MG tablet  Every 6 hours PRN        07/18/20 2025    ondansetron (ZOFRAN ODT) 4  MG disintegrating tablet  Every 8 hours PRN        07/18/20 2025              This chart was dictated using voice recognition software/Dragon. Despite best efforts to proofread, errors can occur which can change the meaning. Any change was purely unintentional.     Karren Cobble 07/18/20 2029    Nance Pear, MD 07/18/20 2046

## 2020-07-18 NOTE — ED Triage Notes (Signed)
Pt reports has a kidney stone on her left side. Pt reports was seen at [facility] and given ceftin, flomax and ibuprofen. Pt was told that if she continued to have pain to come to the ED. Pt reports the stone has moved and now she has pressure down below.

## 2020-07-18 NOTE — Discharge Instructions (Addendum)
You have been prescribed Percocet for pain. Please continue taking Cipro and Flomax.

## 2020-07-20 ENCOUNTER — Other Ambulatory Visit: Payer: Self-pay

## 2020-07-20 ENCOUNTER — Ambulatory Visit: Payer: 59 | Admitting: Urology

## 2020-07-20 ENCOUNTER — Ambulatory Visit
Admission: RE | Admit: 2020-07-20 | Discharge: 2020-07-20 | Disposition: A | Discharge status: Home / Self Care | Payer: 59 | Source: Ambulatory Visit | Attending: Urology | Admitting: Urology

## 2020-07-20 ENCOUNTER — Encounter: Payer: Self-pay | Admitting: Urology

## 2020-07-20 ENCOUNTER — Other Ambulatory Visit: Payer: Self-pay | Admitting: *Deleted

## 2020-07-20 ENCOUNTER — Ambulatory Visit
Admission: RE | Admit: 2020-07-20 | Discharge: 2020-07-20 | Disposition: A | Discharge status: Home / Self Care | Payer: 59 | Attending: Urology | Admitting: Urology

## 2020-07-20 VITALS — BP 133/83 | HR 98 | Ht 64.0 in | Wt 195.0 lb

## 2020-07-20 DIAGNOSIS — N201 Calculus of ureter: Secondary | ICD-10-CM

## 2020-07-20 DIAGNOSIS — N200 Calculus of kidney: Secondary | ICD-10-CM

## 2020-07-20 DIAGNOSIS — R3129 Other microscopic hematuria: Secondary | ICD-10-CM

## 2020-07-20 LAB — URINE CULTURE: Culture: NO GROWTH

## 2020-07-20 NOTE — Progress Notes (Signed)
07/20/2020 11:04 AM   Teresa Moss 26-Nov-1970 [id number]  Referring provider: Marinda Elk, MD [place] [facility] [place],  [place] [address] Chief Complaint  Patient presents with  . Follow-up    ER follow-up for Kidney stone    HPI: Teresa Moss is a 50 y.o. female with a history of nephrolithiasis who is seen today for a evaluation and management.  History of nephrolithiasis Renal CT study performed in 12/2016 noted adrenal glands and right kidney are unremarkable. Punctate stones in the left kidney. Ureters are decompressed. Bladder is low in volume.  KUB in 09/2017 noted a 2 mm left renal stone.  KUB on 10/19/2018 notes a stable 2 mm left renal stone.  KUB on 10/31/2019 notes a stable 2 mm left lower pole renal stone.   The patient was presented to the ED on 07/18/2020 for left-sided flank pain and right and left-sided lower pelvic discomfort. Patient denied dysuria, hematuria or increased urinary frequency.  Patient stated that she had some nausea but no vomiting.  Denied a history of pyelonephritis.    Patient was seen and evaluated by urgent care earlier that morning and had a KUB which could not identify a kidney stone.   Patient stated that she had an extensive history of nephrolithiasis with lithotripsy and her current symptoms feel similar to nephrolithiasis pain she had experienced in the past. Denied fever and chills.  She denied falls or mechanisms of trauma.  She was sent home on Cipro, Zofran and Flomax.  UA showed moderate blood, RBC 21-50, WBC 0-5, few bacteria, squamous epithelial 6-10 and some mucus present. Associated urine culture had no growth.   CT renal stone study revealed a 3 mm obstructing renal stone at the left UVJ. Bilateral 1 mm nonobstructing renal calculi. Sigmoid diverticulosis.  She reports percocet and zofran kept her up at night. She reports ibuprofen 800 mg relieved her pain. She reports dysuria but denies hematuria.  Denies burning sensation with urination. She has some mild back pain. She reports a low grade fever of 99.4. No chills. Denies passing her stone yet.   She reports that she drinks a lot of tea. She denies drinking enough water.  She continues to have night sweats.   She has a family history of cancer. Her father has bladder cancer (extensive smoking history).    PMH: Past Medical History:  Diagnosis Date  . Abnormal vaginal bleeding   . GERD (gastroesophageal reflux disease)   . HLD (hyperlipidemia)   . Kidney stone    Right  . Kidney stones     Surgical History: Past Surgical History:  Procedure Laterality Date  . CHOLECYSTECTOMY  2012    Home Medications:  Allergies as of 07/20/2020      Reactions   Codeine Other (See Comments)   Stimulation/paradoxical agitation   Hydrocodone Other (See Comments)   Paradoxical agitation   Macrobid [nitrofurantoin] Other (See Comments)   Profuse Sweating, Hot flashes, and tremors   Morphine Other (See Comments)   agitation   Penicillins Other (See Comments)   Unknown Reaction- Occurred as a child   Promethazine Hcl Other (See Comments)      Medication List       Accurate as of July 20, 2020 11:04 AM. If you have any questions, ask your nurse or doctor.        buPROPion 150 MG 24 hr tablet Commonly known as: WELLBUTRIN XL   Cholecalciferol 25 MCG (1000 UT)  tablet Take 1,000 Units daily by mouth.   levonorgestrel 20 MCG/24HR IUD Commonly known as: MIRENA 1 each once by Intrauterine route.   lovastatin 40 MG tablet Commonly known as: MEVACOR Take 40 mg at bedtime by mouth.   ondansetron 4 MG disintegrating tablet Commonly known as: Zofran ODT Take 1 tablet (4 mg total) by mouth every 8 (eight) hours as needed for up to 5 days.   oxyCODONE-acetaminophen 5-325 MG tablet Commonly known as: PERCOCET/ROXICET Take 1 tablet by mouth every 6 (six) hours as needed for up to 3 days.   pantoprazole 40 MG tablet Commonly  known as: PROTONIX Take 40 mg by mouth daily.   solifenacin 10 MG tablet Commonly known as: VESICARE TAKE 1 TABLET BY MOUTH  DAILY       Allergies:  Allergies  Allergen Reactions  . Codeine Other (See Comments)    Stimulation/paradoxical agitation  . Hydrocodone Other (See Comments)    Paradoxical agitation  . Macrobid [Nitrofurantoin] Other (See Comments)    Profuse Sweating, Hot flashes, and tremors  . Morphine Other (See Comments)    agitation  . Penicillins Other (See Comments)    Unknown Reaction- Occurred as a child  . Promethazine Hcl Other (See Comments)    Family History: Family History  Problem Relation Age of Onset  . Heart disease Mother   . Heart disease Father   . Bladder Cancer Father   . Kidney disease Neg Hx   . Kidney cancer Neg Hx   . Breast cancer Neg Hx     Social History:  reports that she has been smoking cigarettes. She has a 10.00 pack-year smoking history. She has never used smokeless tobacco. She reports current alcohol use of about 1.0 standard drink of alcohol per week. She reports that she does not use drugs.   Physical Exam: BP 133/83   Pulse 98   Ht 5\' 4"  (1.626 m)   Wt 195 lb (88.5 kg)   BMI 33.47 kg/m   Constitutional:  Alert and oriented, No acute distress. HEENT: [place] AT, moist mucus membranes.  Trachea midline, no masses. Cardiovascular: No clubbing, cyanosis, or edema. Respiratory: Normal respiratory effort, no increased work of breathing. Skin: No rashes, bruises or suspicious lesions. Neurologic: Grossly intact, no focal deficits, moving all 4 extremities. Psychiatric: Normal mood and affect.  Laboratory Data: Lab Results  Component Value Date   CREATININE 1.47 (H) 07/18/2020   Urinalysis Component     Latest Ref Rng & Units 07/18/2020  Color, Urine     YELLOW YELLOW (A)  Appearance     CLEAR HAZY (A)  Specific Gravity, Urine     1.005 - 1.030 1.021  pH     5.0 - 8.0 5.0  Glucose, UA     NEGATIVE mg/dL  NEGATIVE  Hgb urine dipstick     NEGATIVE MODERATE (A)  Bilirubin Urine     NEGATIVE NEGATIVE  Ketones, ur     NEGATIVE mg/dL NEGATIVE  Protein     NEGATIVE mg/dL NEGATIVE  Nitrite     NEGATIVE NEGATIVE  Leukocytes,Ua     NEGATIVE NEGATIVE  RBC / HPF     0 - 5 RBC/hpf 21-50  WBC, UA     0 - 5 WBC/hpf 0-5  Bacteria, UA     NONE SEEN FEW (A)  Squamous Epithelial / LPF     0 - 5 6-10  Mucus      PRESENT    Pertinent Imaging: Results for  orders placed during the hospital encounter of 07/18/20  CT Renal Stone Study  Narrative CLINICAL DATA:  Left flank pain.  EXAM: CT ABDOMEN AND PELVIS WITHOUT CONTRAST  TECHNIQUE: Multidetector CT imaging of the abdomen and pelvis was performed following the standard protocol without IV contrast.  COMPARISON:  December 21, 2016  FINDINGS: Lower chest: No acute abnormality.  Hepatobiliary: No focal liver abnormality is seen. No gallstones, gallbladder wall thickening, or biliary dilatation.  Pancreas: Unremarkable. No pancreatic ductal dilatation or surrounding inflammatory changes.  Spleen: Normal in size without focal abnormality.  Adrenals/Urinary Tract: Adrenal glands are unremarkable. Kidneys are normal in size, without focal lesions. 1 mm nonobstructing renal calculi are seen within both kidneys. A 3 mm obstructing renal stone is seen at the left UVJ, with mild left-sided hydronephrosis, hydroureter and perinephric inflammatory fat stranding. Bladder is unremarkable.  Stomach/Bowel: Stomach is within normal limits. The appendix is not clearly identified. No evidence of bowel wall thickening, distention, or inflammatory changes. Noninflamed diverticula are seen throughout the sigmoid colon.  Vascular/Lymphatic: No significant vascular findings are present. No enlarged abdominal or pelvic lymph nodes.  Reproductive: An IUD is seen within an otherwise normal appearing uterus.  Other: No abdominal wall hernia or  abnormality. No abdominopelvic ascites.  Musculoskeletal: Degenerative changes are seen at the level of L5-S1.  IMPRESSION: 1. 3 mm obstructing renal stone at the left UVJ. 2. Bilateral 1 mm nonobstructing renal calculi. 3. Sigmoid diverticulosis.   Electronically Signed By: Virgina Norfolk M.D. On: 07/18/2020 19:59 I have personally reviewed the images and agree with radiologist interpretation.  See HPI.  Assessment & Plan:     1. Left UVJ stone (calcium oxalate)   We discussed general stone prevention techniques including drinking plenty water with goal of producing 2.5 L urine daily, increased citric acid intake, avoidance of high oxalate containing foods, and decreased salt intake.  Information about dietary recommendations given today.   Continue medical explusion therapy with Flomax and ibuprofen.   Patient is not interseted surgical intervention at this time.   RTC in 2 weeks with KUB and sooner if symptoms worsen.  2. Microscopic hematuria  UA on 07/18/2020 showed moderate blood, RBC 21-50, WBC 0-5, few bacteria, squamous epithelial 6-10 and some mucus present. Associated urine culture had no growth.   Will repeat UA in 2 weeks.  Fransico Him, am acting as a Education administrator for [employer],  I have reviewed the above documentation for accuracy and completeness, and I agree with the above.    Zara Council, PA-C   [facility] [address], [place], [place] [address] [phone]

## 2020-07-24 ENCOUNTER — Other Ambulatory Visit: Payer: Self-pay | Admitting: Urology

## 2020-07-24 MED ORDER — CIPROFLOXACIN HCL 500 MG PO TABS
500.0000 mg | ORAL_TABLET | Freq: Two times a day (BID) | ORAL | 0 refills | Status: DC
Start: 2020-07-24 — End: 2021-01-07

## 2020-07-24 NOTE — Progress Notes (Signed)
Cipro sent into pharmacy.

## 2020-08-03 ENCOUNTER — Ambulatory Visit: Payer: 59 | Admitting: Urology

## 2020-08-03 NOTE — Progress Notes (Signed)
07/20/2020 3:18 PM   Teresa Moss Feb 16, 1971 [id number]  Referring provider: Marinda Elk, MD [place] [facility] [place],  [place] [address] Chief Complaint  Patient presents with  . Nephrolithiasis   Urological history: 1. Nephrolithiasis -Spontaneous passage of stones -Stone composition of 95% calcium oxalate monohydrate, 2% calcium oxalate dihydrate and 3% calcium phosphate -24 hour metabolic work up demonstrated suboptimal urine volume, hypomagnesiuria, marketed hypocitraturia, borderline low urine pH and mild uric acid supersaturation -most recent episode CT renal stone study 3 mm stone at left UVJ  2. Family history of bladder cancer -father has bladder cancer (extensive smoking history)  3. OAB -contributing factors of age, depression  -managed with Vesicare 10 mg daily  -cysto 2018 NED  4. Mixed incontinence -Grade 2 hypermobility of the bladder neck and no stress incontinence on 2018 UDS   HPI: Teresa Moss is a 50 y.o. female who presents today for follow up.  She has passed her 3 mm stone.  It is not visible on today's KUB.    She is no longer having any renal colic and denies any gross hematuria.  Her UA today is negative for microscopic hematuria.  She is interested in pursuing alternative therapies for her OAB.Patient denies any modifying or aggravating factors.  Patient denies any gross hematuria, dysuria or suprapubic/flank pain.  Patient denies any fevers, chills, nausea or vomiting.   PMH: Past Medical History:  Diagnosis Date  . Abnormal vaginal bleeding   . GERD (gastroesophageal reflux disease)   . HLD (hyperlipidemia)   . Kidney stone    Right  . Kidney stones     Surgical History: Past Surgical History:  Procedure Laterality Date  . CHOLECYSTECTOMY  2012    Home Medications:  Allergies as of 08/04/2020      Reactions   Codeine Other (See Comments)   Stimulation/paradoxical agitation   Hydrocodone Other (See  Comments)   Paradoxical agitation   Macrobid [nitrofurantoin] Other (See Comments)   Profuse Sweating, Hot flashes, and tremors   Morphine Other (See Comments)   agitation   Penicillins Other (See Comments)   Unknown Reaction- Occurred as a child   Promethazine Hcl Other (See Comments)      Medication List       Accurate as of August 04, 2020 11:59 PM. If you have any questions, ask your nurse or doctor.        buPROPion 150 MG 24 hr tablet Commonly known as: WELLBUTRIN XL   Cholecalciferol 25 MCG (1000 UT) tablet Take 1,000 Units daily by mouth.   ciprofloxacin 500 MG tablet Commonly known as: CIPRO Take 1 tablet (500 mg total) by mouth every 12 (twelve) hours.   levonorgestrel 20 MCG/24HR IUD Commonly known as: MIRENA 1 each once by Intrauterine route.   lovastatin 40 MG tablet Commonly known as: MEVACOR Take 40 mg at bedtime by mouth.   pantoprazole 40 MG tablet Commonly known as: PROTONIX Take 40 mg by mouth daily.   solifenacin 10 MG tablet Commonly known as: VESICARE TAKE 1 TABLET BY MOUTH  DAILY       Allergies:  Allergies  Allergen Reactions  . Codeine Other (See Comments)    Stimulation/paradoxical agitation  . Hydrocodone Other (See Comments)    Paradoxical agitation  . Macrobid [Nitrofurantoin] Other (See Comments)    Profuse Sweating, Hot flashes, and tremors  . Morphine Other (See Comments)    agitation  . Penicillins Other (See Comments)  Unknown Reaction- Occurred as a child  . Promethazine Hcl Other (See Comments)    Family History: Family History  Problem Relation Age of Onset  . Heart disease Mother   . Heart disease Father   . Bladder Cancer Father   . Kidney disease Neg Hx   . Kidney cancer Neg Hx   . Breast cancer Neg Hx     Social History:  reports that she has been smoking cigarettes. She has a 10.00 pack-year smoking history. She has never used smokeless tobacco. She reports current alcohol use of about 1.0 standard  drink of alcohol per week. She reports that she does not use drugs.   Physical Exam: BP 133/87   Pulse 89   Ht 5\' 4"  (1.626 m)   Wt 193 lb (87.5 kg)   BMI 33.13 kg/m   Constitutional:  Well nourished. Alert and oriented, No acute distress. HEENT: [place] AT, mask in place.  Trachea midline Cardiovascular: No clubbing, cyanosis, or edema. Respiratory: Normal respiratory effort, no increased work of breathing. Neurologic: Grossly intact, no focal deficits, moving all 4 extremities. Psychiatric: Normal mood and affect.   Laboratory Data: Urinalysis Component     Latest Ref Rng & Units 08/04/2020  Specific Gravity, UA     1.005 - 1.030 1.025  pH, UA     5.0 - 7.5 5.5  Color, UA     Yellow Yellow  Appearance Ur     Clear Clear  Leukocytes,UA     Negative Negative  Protein,UA     Negative/Trace Trace (A)  Glucose, UA     Negative Negative  Ketones, UA     Negative Negative  RBC, UA     Negative Trace (A)  Bilirubin, UA     Negative Negative  Urobilinogen, Ur     0.2 - 1.0 mg/dL 0.2  Nitrite, UA     Negative Negative  Microscopic Examination      See below:   Component     Latest Ref Rng & Units 08/04/2020  WBC, UA     0 - 5 /hpf 0-5  RBC     0 - 2 /hpf 0-2  Epithelial Cells (non renal)     0 - 10 /hpf 0-10  Bacteria, UA     None seen/Few None seen   I have reviewed the labs.  Pertinent Imaging: Narrative & Impression  CLINICAL DATA:  Nephrolithiasis.  Recently passed kidney stone  EXAM: ABDOMEN - 1 VIEW  COMPARISON:  07/18/2020  FINDINGS: The bowel gas pattern is normal. Previously seen 3 mm calcification in the region of the left UVJ is no longer evident. No calcifications are seen projecting over either renal shadow. Stable small pelvic phleboliths. Cholecystectomy clips. IUD. No other significant radiographic abnormality are seen.  IMPRESSION: Previously seen 3 mm calcification in the region of the left UVJ is no longer  evident.   Electronically Signed   By: Davina Poke D.O.   On: 08/05/2020 10:52      Assessment & Plan:    1. Left UVJ stone  -Spontaneously passed  2. Microscopic hematuria  -resolved  3. OAB -We reviewed Botox therapy and PTNS therapy for overactive bladder and she is most interested in PTNS.  We will contact her insurance to get prior approval and if patient is agreeable we will schedule PTNS treatments.   Zara Council, PA-C [facility] [address], [place], [place] [address] [phone]

## 2020-08-04 ENCOUNTER — Other Ambulatory Visit: Payer: Self-pay | Admitting: Family Medicine

## 2020-08-04 ENCOUNTER — Ambulatory Visit
Admission: RE | Admit: 2020-08-04 | Discharge: 2020-08-04 | Disposition: A | Discharge status: Home / Self Care | Payer: 59 | Source: Ambulatory Visit | Attending: Urology | Admitting: Urology

## 2020-08-04 ENCOUNTER — Ambulatory Visit: Payer: 59 | Admitting: Urology

## 2020-08-04 ENCOUNTER — Encounter: Payer: Self-pay | Admitting: Urology

## 2020-08-04 ENCOUNTER — Other Ambulatory Visit: Payer: Self-pay

## 2020-08-04 VITALS — BP 133/87 | HR 89 | Ht 64.0 in | Wt 193.0 lb

## 2020-08-04 DIAGNOSIS — N3281 Overactive bladder: Secondary | ICD-10-CM | POA: Diagnosis not present

## 2020-08-04 DIAGNOSIS — N200 Calculus of kidney: Secondary | ICD-10-CM | POA: Diagnosis not present

## 2020-08-05 ENCOUNTER — Telehealth: Payer: Self-pay

## 2020-08-05 LAB — MICROSCOPIC EXAMINATION: Bacteria, UA: NONE SEEN

## 2020-08-05 LAB — URINALYSIS, COMPLETE
Bilirubin, UA: NEGATIVE
Glucose, UA: NEGATIVE
Ketones, UA: NEGATIVE
Leukocytes,UA: NEGATIVE
Nitrite, UA: NEGATIVE
Specific Gravity, UA: 1.025 (ref 1.005–1.030)
Urobilinogen, Ur: 0.2 mg/dL (ref 0.2–1.0)
pH, UA: 5.5 (ref 5.0–7.5)

## 2020-08-05 NOTE — Telephone Encounter (Signed)
No PA required for PTNS. Patient scheduled for 12 PTNS visits. Patient aware via Mychart.

## 2020-08-24 ENCOUNTER — Other Ambulatory Visit: Payer: Self-pay

## 2020-08-24 ENCOUNTER — Ambulatory Visit (INDEPENDENT_AMBULATORY_CARE_PROVIDER_SITE_OTHER): Payer: 59

## 2020-08-24 ENCOUNTER — Ambulatory Visit: Payer: Self-pay

## 2020-08-24 DIAGNOSIS — N3281 Overactive bladder: Secondary | ICD-10-CM

## 2020-08-24 NOTE — Patient Instructions (Addendum)
Tracking Your Bladder Symptoms    Patient Name:___________________________________________________   Sample: Day   Daytime Voids  Nighttime Voids Urgency for the Day(0-4) Number of Accidents Beverage Comments  Monday IIII II 2 I Water IIII Coffee  I      Week Starting:____________________________________   Day Daytime  Voids Nighttime  Voids Urgency for  The Day(0-4) Number of Accidents Beverages Comments                                                           This week my symptoms were:  O much better  O better O the same O worse   

## 2020-08-24 NOTE — Progress Notes (Signed)
PTNS  Session # 1  Health & Social Factors: no change Caffeine: 1 Alcohol: 0 Daytime voids #per day: 7-8 Night-time voids #per night: 1-2 Urgency: strong Incontinence Episodes #per day: 0 Ankle used: right Treatment Setting: 3 Feeling/ Response: both Comments: patient tolerated well, discussed possible timed voiding every 2 hours- patient works from home Patient states she stopped taking her Vesicare on 4/15 due to dryness and would like to continue off of Vesicare while having her PTNS treatments  Performed By: Fonnie Jarvis, CMA   Follow Up: 1 week

## 2020-08-31 ENCOUNTER — Other Ambulatory Visit: Payer: Self-pay

## 2020-08-31 ENCOUNTER — Ambulatory Visit (INDEPENDENT_AMBULATORY_CARE_PROVIDER_SITE_OTHER): Payer: 59 | Admitting: Family Medicine

## 2020-08-31 DIAGNOSIS — N3281 Overactive bladder: Secondary | ICD-10-CM | POA: Diagnosis not present

## 2020-08-31 NOTE — Progress Notes (Signed)
PTNS  Session # 2  Health & Social Factors: no change Caffeine: 4-5 Alcohol: 0 Daytime voids #per day: 5-6 Night-time voids #per night: 2 Urgency: mild Incontinence Episodes #per day: 0-1 Ankle used: left Treatment Setting: 8 Feeling/ Response: sensory and toe flex Comments: Patient tolerated well  Performed By: Elberta Leatherwood, CMA  Follow Up: 1 week #3

## 2020-08-31 NOTE — Patient Instructions (Signed)
Tracking Your Bladder Symptoms    Patient Name:___________________________________________________   Sample: Day   Daytime Voids  Nighttime Voids Urgency for the Day(0-4) Number of Accidents Beverage Comments  Monday IIII II 2 I Water IIII Coffee  I      Week Starting:____________________________________   Day Daytime  Voids Nighttime  Voids Urgency for  The Day(0-4) Number of Accidents Beverages Comments                                                           This week my symptoms were:  O much better  O better O the same O worse   

## 2020-09-07 ENCOUNTER — Other Ambulatory Visit: Payer: Self-pay

## 2020-09-07 ENCOUNTER — Ambulatory Visit (INDEPENDENT_AMBULATORY_CARE_PROVIDER_SITE_OTHER): Payer: 59

## 2020-09-07 DIAGNOSIS — N3281 Overactive bladder: Secondary | ICD-10-CM

## 2020-09-07 NOTE — Progress Notes (Signed)
PTNS  Session # 3  Health & Social Factors: Patient has been moving, drinking more coke & tea Caffeine: 3 Alcohol: 0 Daytime voids #per day: 5 Night-time voids #per night: 1 Urgency: Mild Incontinence Episodes #per day: 0 Ankle used: Right Treatment Setting: 12 Feeling/ Response: Sensory & Toe Flex Comments: N/A  Performed By: Gordy Clement, CMA   Follow Up: RTC in 1 week

## 2020-09-07 NOTE — Patient Instructions (Signed)
Tracking Your Bladder Symptoms    Patient Name:___________________________________________________   Sample: Day   Daytime Voids  Nighttime Voids Urgency for the Day(0-4) Number of Accidents Beverage Comments  Monday IIII II 2 I Water IIII Coffee  I      Week Starting:____________________________________   Day Daytime  Voids Nighttime  Voids Urgency for  The Day(0-4) Number of Accidents Beverages Comments                                                           This week my symptoms were:  O much better  O better O the same O worse   

## 2020-09-14 ENCOUNTER — Ambulatory Visit (INDEPENDENT_AMBULATORY_CARE_PROVIDER_SITE_OTHER): Payer: 59 | Admitting: *Deleted

## 2020-09-14 ENCOUNTER — Other Ambulatory Visit: Payer: Self-pay

## 2020-09-14 DIAGNOSIS — N3281 Overactive bladder: Secondary | ICD-10-CM

## 2020-09-14 NOTE — Progress Notes (Signed)
PTNS  Session # 4  Health & Social Factors: Patient has been moving, drinking more coke & tea Caffeine: 3 Alcohol: 0 Daytime voids #per day: 5 Night-time voids #per night: 1 Urgency: Mild Incontinence Episodes #per day: 0 Ankle used: Right Treatment Setting: 7 Feeling/ Response: Sensory & Toe Flex Comments: N/A  Performed By: Gaspar Cola , CMA   Follow Up: RTC in 1 week

## 2020-09-21 ENCOUNTER — Other Ambulatory Visit: Payer: Self-pay

## 2020-09-21 ENCOUNTER — Ambulatory Visit (INDEPENDENT_AMBULATORY_CARE_PROVIDER_SITE_OTHER): Payer: 59

## 2020-09-21 DIAGNOSIS — N3281 Overactive bladder: Secondary | ICD-10-CM

## 2020-09-21 NOTE — Progress Notes (Signed)
PTNS  Session # 5  Health & Social Factors: no change Caffeine: 2-3 Alcohol: 0 Daytime voids #per day: 5-6 Night-time voids #per night: 0-1 Urgency: mild Incontinence Episodes #per day: 0 Ankle used: right Treatment Setting: 9 Feeling/ Response: both Comments: patient tolerated well  Performed By: Alva Garnet   Follow Up: 1 week

## 2020-09-21 NOTE — Patient Instructions (Signed)
Tracking Your Bladder Symptoms    Patient Name:___________________________________________________   Sample: Day   Daytime Voids  Nighttime Voids Urgency for the Day(0-4) Number of Accidents Beverage Comments  Monday IIII II 2 I Water IIII Coffee  I      Week Starting:____________________________________   Day Daytime  Voids Nighttime  Voids Urgency for  The Day(0-4) Number of Accidents Beverages Comments                                                           This week my symptoms were:  O much better  O better O the same O worse   

## 2020-09-28 ENCOUNTER — Other Ambulatory Visit: Payer: Self-pay

## 2020-09-28 ENCOUNTER — Ambulatory Visit (INDEPENDENT_AMBULATORY_CARE_PROVIDER_SITE_OTHER): Payer: 59 | Admitting: Family Medicine

## 2020-09-28 DIAGNOSIS — N3281 Overactive bladder: Secondary | ICD-10-CM | POA: Diagnosis not present

## 2020-09-28 NOTE — Progress Notes (Signed)
PTNS  Session # 6  Health & Social Factors: Urgency has gotten better Caffeine: 2 Alcohol: 0 Daytime voids #per day: 5-6 Night-time voids #per night: 0-1 Urgency: mild Incontinence Episodes #per day: 0 Ankle used: right Treatment Setting: 5 Feeling/ Response: sensory Comments: patient tolerated well  Performed By: [place]ta Leatherwood, CMA  Follow Up: 1 week #7 of 45

## 2020-09-28 NOTE — Patient Instructions (Signed)
Tracking Your Bladder Symptoms    Patient Name:___________________________________________________   Sample: Day   Daytime Voids  Nighttime Voids Urgency for the Day(0-4) Number of Accidents Beverage Comments  Monday IIII II 2 I Water IIII Coffee  I      Week Starting:____________________________________   Day Daytime  Voids Nighttime  Voids Urgency for  The Day(0-4) Number of Accidents Beverages Comments                                                           This week my symptoms were:  O much better  O better O the same O worse   

## 2020-10-12 ENCOUNTER — Ambulatory Visit: Payer: 59

## 2020-10-12 ENCOUNTER — Other Ambulatory Visit: Payer: Self-pay

## 2020-10-19 ENCOUNTER — Ambulatory Visit (INDEPENDENT_AMBULATORY_CARE_PROVIDER_SITE_OTHER): Payer: 59

## 2020-10-19 ENCOUNTER — Other Ambulatory Visit: Payer: Self-pay

## 2020-10-19 DIAGNOSIS — N3281 Overactive bladder: Secondary | ICD-10-CM

## 2020-10-19 NOTE — Patient Instructions (Signed)
Tracking Your Bladder Symptoms    Patient Name:___________________________________________________   Sample: Day   Daytime Voids  Nighttime Voids Urgency for the Day(0-4) Number of Accidents Beverage Comments  Monday IIII II 2 I Water IIII Coffee  I      Week Starting:____________________________________   Day Daytime  Voids Nighttime  Voids Urgency for  The Day(0-4) Number of Accidents Beverages Comments                                                           This week my symptoms were:  O much better  O better O the same O worse   

## 2020-10-19 NOTE — Progress Notes (Signed)
PTNS  Session # 7 of 45  Health & Social Factors: no change Caffeine: 2 Alcohol: 0 Daytime voids #per day: 5-6 Night-time voids #per night: 0 (1 time all week) Urgency: mild Incontinence Episodes #per day: 0 (1 episode of stress incontinence after sneezing) Ankle used: right Treatment Setting: 7 Feeling/ Response: sensory Comments: tolerated well  Performed By: Fonnie Jarvis, CMA   Follow Up: 1 week

## 2020-10-26 ENCOUNTER — Ambulatory Visit: Payer: Self-pay

## 2020-10-27 ENCOUNTER — Other Ambulatory Visit: Payer: Self-pay

## 2020-10-27 ENCOUNTER — Ambulatory Visit (INDEPENDENT_AMBULATORY_CARE_PROVIDER_SITE_OTHER): Payer: 59

## 2020-10-27 DIAGNOSIS — N3281 Overactive bladder: Secondary | ICD-10-CM | POA: Diagnosis not present

## 2020-10-27 NOTE — Patient Instructions (Signed)
Tracking Your Bladder Symptoms    Patient Name:___________________________________________________   Sample: Day   Daytime Voids  Nighttime Voids Urgency for the Day(0-4) Number of Accidents Beverage Comments  Monday IIII II 2 I Water IIII Coffee  I      Week Starting:____________________________________   Day Daytime  Voids Nighttime  Voids Urgency for  The Day(0-4) Number of Accidents Beverages Comments                                                           This week my symptoms were:  O much better  O better O the same O worse   

## 2020-10-27 NOTE — Progress Notes (Signed)
PTNS  Session # 8  Health & Social Factors: No Change  Caffeine: 2 Alcohol: 0 Daytime voids #per day: 6 Night-time voids #per night: 1 Urgency: Mild  Incontinence Episodes #per day: 0 Ankle used: Right Treatment Setting: 6 Feeling/ Response: Sensory & Toe Flex  Comments: Pt did not bring voiding diary   Performed By: Gordy Clement, CMA   Follow Up: RTC in 1 week

## 2020-11-02 ENCOUNTER — Ambulatory Visit: Payer: Self-pay

## 2020-11-16 ENCOUNTER — Ambulatory Visit (INDEPENDENT_AMBULATORY_CARE_PROVIDER_SITE_OTHER): Payer: 59 | Admitting: Physician Assistant

## 2020-11-16 ENCOUNTER — Other Ambulatory Visit: Payer: Self-pay

## 2020-11-16 DIAGNOSIS — N3281 Overactive bladder: Secondary | ICD-10-CM

## 2020-11-16 NOTE — Progress Notes (Signed)
PTNS  Session # 9 of 45  Health & Social Factors: Increase in caffeine intake  Caffeine: 4 Alcohol: 0 Daytime voids #per day: 8 Night-time voids #per night: 2 Urgency: Mild Incontinence Episodes #per day: 0 Ankle used: Right Treatment Setting: 8 Feeling/ Response: Sensory & Toe Flex Comments: Patient notes she has been consuming more Coca-Cola than normal and believes this is why her urinary symptoms have been worse.   Performed By: Gordy Clement, CMA    Follow Up: RTC in 1 week

## 2020-11-23 ENCOUNTER — Ambulatory Visit (INDEPENDENT_AMBULATORY_CARE_PROVIDER_SITE_OTHER): Payer: 59 | Admitting: *Deleted

## 2020-11-23 ENCOUNTER — Other Ambulatory Visit: Payer: Self-pay

## 2020-11-23 ENCOUNTER — Other Ambulatory Visit: Payer: Self-pay | Admitting: Physician Assistant

## 2020-11-23 DIAGNOSIS — N3281 Overactive bladder: Secondary | ICD-10-CM | POA: Diagnosis not present

## 2020-11-23 DIAGNOSIS — Z1231 Encounter for screening mammogram for malignant neoplasm of breast: Secondary | ICD-10-CM

## 2020-11-23 NOTE — Progress Notes (Signed)
PTNS   Session # 10 of 45   Health & Social Factors: Increase in caffeine intake  Caffeine: 4 Alcohol: 0 Daytime voids #per day: 8 Night-time voids #per night: 2 Urgency: Mild Incontinence Episodes #per day: 0 Ankle used: Right Treatment Setting: 4 Feeling/ Response: Sensory & Toe Flex Comments:    Performed By: Gaspar Cola  CMA             Follow Up: RTC in 1 week

## 2020-11-27 ENCOUNTER — Ambulatory Visit: Payer: Self-pay | Admitting: Urology

## 2020-11-30 ENCOUNTER — Other Ambulatory Visit: Payer: Self-pay

## 2020-11-30 ENCOUNTER — Ambulatory Visit (INDEPENDENT_AMBULATORY_CARE_PROVIDER_SITE_OTHER): Payer: 59 | Admitting: Physician Assistant

## 2020-11-30 DIAGNOSIS — N3281 Overactive bladder: Secondary | ICD-10-CM | POA: Diagnosis not present

## 2020-11-30 NOTE — Progress Notes (Signed)
PTNS  Session # 11 of 45  Health & Social Factors: no change Caffeine: 2 Alcohol: 0 Daytime voids #per day: 6 Night-time voids #per night: 0-1 Urgency: mild Incontinence Episodes #per day: 0 Ankle used: right Treatment Setting: 6 Feeling/ Response: both Comments: patient tolerated well  Performed By: Fonnie Jarvis, CMA   Follow Up: 1 week

## 2020-11-30 NOTE — Patient Instructions (Signed)
Tracking Your Bladder Symptoms    Patient Name:___________________________________________________   Sample: Day   Daytime Voids  Nighttime Voids Urgency for the Day(0-4) Number of Accidents Beverage Comments  Monday IIII II 2 I Water IIII Coffee  I      Week Starting:____________________________________   Day Daytime  Voids Nighttime  Voids Urgency for  The Day(0-4) Number of Accidents Beverages Comments                                                           This week my symptoms were:  O much better  O better O the same O worse   

## 2020-12-07 ENCOUNTER — Ambulatory Visit (INDEPENDENT_AMBULATORY_CARE_PROVIDER_SITE_OTHER): Payer: 59

## 2020-12-07 ENCOUNTER — Other Ambulatory Visit: Payer: Self-pay

## 2020-12-07 DIAGNOSIS — N3281 Overactive bladder: Secondary | ICD-10-CM | POA: Diagnosis not present

## 2020-12-07 NOTE — Progress Notes (Signed)
PTNS  Session # 12  Health & Social Factors: No Change Caffeine: 2 Alcohol: 0 Daytime voids #per day: 6 Night-time voids #per night: 0-1 Urgency: Mild Incontinence Episodes #per day: 0 Ankle used: Right Treatment Setting: 8 Feeling/ Response: Sensory & Toe Flex Comments: Pt scheduled for 1 month follow up w/ provider  Performed By: Gordy Clement, CMA   Follow Up: RTC in 1 month

## 2020-12-21 ENCOUNTER — Other Ambulatory Visit: Payer: Self-pay

## 2020-12-21 ENCOUNTER — Ambulatory Visit
Admission: RE | Admit: 2020-12-21 | Discharge: 2020-12-21 | Disposition: A | Discharge status: Home / Self Care | Payer: 59 | Source: Ambulatory Visit | Attending: Physician Assistant | Admitting: Physician Assistant

## 2020-12-21 DIAGNOSIS — Z1231 Encounter for screening mammogram for malignant neoplasm of breast: Secondary | ICD-10-CM | POA: Insufficient documentation

## 2021-01-06 NOTE — Progress Notes (Signed)
01/07/21 2:11 PM   Teresa Moss 01-Aug-1970 [id number]  Referring provider:  Marinda Elk, MD [place] [place],  [place] [address] Chief Complaint  Patient presents with   Over Active Bladder   Urological History:   1. Nephrolithiasis  -Spontaneous passage of stones  -Stone composition of 95% calcium oxalate monohydrate, 2% calcium oxalate dihydrate and 3% calcium phosphate  -24 hour metabolic work up demonstrated suboptimal urine volume, hypomagnesiuria, marketed hypocitraturia, borderline low urine pH and mild uric acid supersaturation   2. Family history of bladder cancer  -father has bladder cancer (extensive smoking history)     3. OAB  -contributing factors of age, depression -managed with Vesicare 10 mg daily  -cysto 2018 NED     4. Mixed incontinence  -Grade 2 hypermobility of the bladder neck and no stress incontinence on 2018 UDS   HPI:  Teresa Moss is a 50 y.o.female who presents today for PTNS follow-up.  The patient is experiencing 1- 7 day time voids, 1-2 nighttime voids, she does experience urinary frequency and urinary leakage when she laughs, coughs sneezes etc. She does not wear incontinence pads, she does engage in toilet mapping.  PVR today is 0 mL.  She reports today that she believes that PTNS has helped. She states she sometimes has to take vesicare due to her caffeine intake.  She has recently lost 35 lbs and her cholesterol has lowered due to weight loss.     PMH: Past Medical History:  Diagnosis Date   Abnormal vaginal bleeding    GERD (gastroesophageal reflux disease)    HLD (hyperlipidemia)    Kidney stone    Right   Kidney stones     Surgical History: Past Surgical History:  Procedure Laterality Date   CHOLECYSTECTOMY  2012    Home Medications:  Allergies as of 01/07/2021       Reactions   Codeine Other (See Comments)   Stimulation/paradoxical agitation   Hydrocodone Other (See Comments)    Paradoxical agitation   Macrobid [nitrofurantoin] Other (See Comments)   Profuse Sweating, Hot flashes, and tremors   Morphine Other (See Comments)   agitation   Penicillins Other (See Comments)   Unknown Reaction- Occurred as a child   Promethazine Hcl Other (See Comments)        Medication List        Accurate as of January 07, 2021  2:11 PM. If you have any questions, ask your nurse or doctor.          STOP taking these medications    ciprofloxacin 500 MG tablet Commonly known as: CIPRO Stopped by: Jayston Trevino, PA-C   pantoprazole 40 MG tablet Commonly known as: PROTONIX Stopped by: Darene Nappi, PA-C       TAKE these medications    buPROPion 150 MG 24 hr tablet Commonly known as: WELLBUTRIN XL   Cholecalciferol 25 MCG (1000 UT) tablet Take 1,000 Units daily by mouth.   levonorgestrel 20 MCG/24HR IUD Commonly known as: MIRENA 1 each once by Intrauterine route.   lovastatin 40 MG tablet Commonly known as: MEVACOR Take 40 mg at bedtime by mouth.   solifenacin 10 MG tablet Commonly known as: VESICARE TAKE 1 TABLET BY MOUTH  DAILY        Allergies:  Allergies  Allergen Reactions   Codeine Other (See Comments)    Stimulation/paradoxical agitation   Hydrocodone Other (See Comments)    Paradoxical agitation   Macrobid [  Nitrofurantoin] Other (See Comments)    Profuse Sweating, Hot flashes, and tremors   Morphine Other (See Comments)    agitation   Penicillins Other (See Comments)    Unknown Reaction- Occurred as a child   Promethazine Hcl Other (See Comments)    Family History: Family History  Problem Relation Age of Onset   Heart disease Mother    Heart disease Father    Bladder Cancer Father    Kidney disease Neg Hx    Kidney cancer Neg Hx    Breast cancer Neg Hx     Social History:  reports that she has been smoking cigarettes. She has a 10.00 pack-year smoking history. She has never used smokeless tobacco. She reports  current alcohol use of about 1.0 standard drink per week. She reports that she does not use drugs.   Physical Exam: BP (!) 141/94   Pulse 83   Ht '5\' 4"'$  (1.626 m)   Wt 193 lb (87.5 kg)   BMI 33.13 kg/m   Constitutional:  Alert and oriented, No acute distress. HEENT: [place] AT, mask in place  Trachea midline Cardiovascular: No clubbing, cyanosis, or edema. Respiratory: Normal respiratory effort, no increased work of breathing. Neurologic: Grossly intact, no focal deficits, moving all 4 extremities. Psychiatric: Normal mood and affect.  Laboratory Data: Lab Results  Component Value Date   CREATININE 1.47 (H) 07/18/2020   Pertinent Imaging: Results for orders placed or performed in visit on 01/07/21  Bladder Scan (Post Void Residual) in office  Result Value Ref Range   Scan Result 0      Assessment & Plan:    OAB  - Continue Vesicare 10 mg daily  - Continue PTNS, monthly maintenance.   Follow-up with [facility] [address], [place], [place] [address] [phone]  [facility] as a scribe for [facility], PA-C.,have documented all relevant documentation on the behalf of Teresa Kozicki, PA-C,as directed by  [facility], PA-C while in the presence of [place], PA-C.  I spent 15 minutes on the day of the encounter to include pre-visit record review, face-to-face time with the patient, and post-visit ordering of tests.

## 2021-01-07 ENCOUNTER — Encounter: Payer: Self-pay | Admitting: Urology

## 2021-01-07 ENCOUNTER — Other Ambulatory Visit: Payer: Self-pay

## 2021-01-07 ENCOUNTER — Ambulatory Visit: Payer: 59 | Admitting: Urology

## 2021-01-07 VITALS — BP 141/94 | HR 83 | Ht 64.0 in | Wt 193.0 lb

## 2021-01-07 DIAGNOSIS — N3281 Overactive bladder: Secondary | ICD-10-CM | POA: Diagnosis not present

## 2021-01-07 LAB — BLADDER SCAN AMB NON-IMAGING: Scan Result: 0

## 2021-01-12 ENCOUNTER — Other Ambulatory Visit: Payer: Self-pay

## 2021-01-12 ENCOUNTER — Ambulatory Visit: Payer: 59 | Admitting: Urology

## 2021-01-12 VITALS — BP 129/84 | HR 94 | Ht 64.0 in | Wt 165.0 lb

## 2021-01-12 DIAGNOSIS — R300 Dysuria: Secondary | ICD-10-CM

## 2021-01-12 LAB — MICROSCOPIC EXAMINATION: Bacteria, UA: NONE SEEN

## 2021-01-12 LAB — URINALYSIS, COMPLETE
Bilirubin, UA: NEGATIVE
Glucose, UA: NEGATIVE
Ketones, UA: NEGATIVE
Leukocytes,UA: NEGATIVE
Nitrite, UA: NEGATIVE
Protein,UA: NEGATIVE
RBC, UA: NEGATIVE
Specific Gravity, UA: >1.03 — ABNORMAL HIGH (ref 1.005–1.030)
Urobilinogen, Ur: 0.2 mg/dL (ref 0.2–1.0)
pH, UA: 5.5 (ref 5.0–7.5)

## 2021-01-14 ENCOUNTER — Encounter: Payer: Self-pay | Admitting: Urology

## 2021-01-14 NOTE — Progress Notes (Signed)
01/14/21 4:11 PM   Teresa Moss 08/05/1970 [id number]  Referring provider:  Marinda Elk, MD [place] [facility] [place],  [place] [address] Chief Complaint  Patient presents with   Dysuria   Urological History:   1. Nephrolithiasis  -Spontaneous passage of stones  -Stone composition of 95% calcium oxalate monohydrate, 2% calcium oxalate dihydrate and 3% calcium phosphate  -24 hour metabolic work up demonstrated suboptimal urine volume, hypomagnesiuria, marketed hypocitraturia, borderline low urine pH and mild uric acid supersaturation   2. Family history of bladder cancer  -father has bladder cancer (extensive smoking history)     3. OAB  -contributing factors of age, depression -managed with Vesicare 10 mg daily  -cysto 2018 NED     4. Mixed incontinence  -Grade 2 hypermobility of the bladder neck and no stress incontinence on 2018 UDS   HPI:  Teresa Moss is a 50 y.o.female who presents today for possible UTI.   She states that over the weekend she developed an uncomfortability during intercourse, pressure in the lower abdomen and difficulty urinating.  She had Levaquin at home from a previous infection, so she started that medication.  UA unremarkable  Patient denies any modifying or aggravating factors.  Patient denies any gross hematuria, dysuria or suprapubic/flank pain.  Patient denies any fevers, chills, nausea or vomiting.    PMH: Past Medical History:  Diagnosis Date   Abnormal vaginal bleeding    GERD (gastroesophageal reflux disease)    HLD (hyperlipidemia)    Kidney stone    Right   Kidney stones     Surgical History: Past Surgical History:  Procedure Laterality Date   CHOLECYSTECTOMY  2012    Home Medications:  Allergies as of 01/12/2021       Reactions   Codeine Other (See Comments)   Stimulation/paradoxical agitation   Hydrocodone Other (See Comments)   Paradoxical agitation   Macrobid [nitrofurantoin] Other  (See Comments)   Profuse Sweating, Hot flashes, and tremors   Morphine Other (See Comments)   agitation   Penicillins Other (See Comments)   Unknown Reaction- Occurred as a child   Promethazine Hcl Other (See Comments)        Medication List        Accurate as of January 12, 2021 11:59 PM. If you have any questions, ask your nurse or doctor.          buPROPion 150 MG 24 hr tablet Commonly known as: WELLBUTRIN XL   Cholecalciferol 25 MCG (1000 UT) tablet Take 1,000 Units daily by mouth.   levonorgestrel 20 MCG/24HR IUD Commonly known as: MIRENA 1 each once by Intrauterine route.   lovastatin 40 MG tablet Commonly known as: MEVACOR Take 40 mg at bedtime by mouth.   solifenacin 10 MG tablet Commonly known as: VESICARE TAKE 1 TABLET BY MOUTH  DAILY        Allergies:  Allergies  Allergen Reactions   Codeine Other (See Comments)    Stimulation/paradoxical agitation   Hydrocodone Other (See Comments)    Paradoxical agitation   Macrobid [Nitrofurantoin] Other (See Comments)    Profuse Sweating, Hot flashes, and tremors   Morphine Other (See Comments)    agitation   Penicillins Other (See Comments)    Unknown Reaction- Occurred as a child   Promethazine Hcl Other (See Comments)    Family History: Family History  Problem Relation Age of Onset   Heart disease Mother    Heart disease Father  Bladder Cancer Father    Kidney disease Neg Hx    Kidney cancer Neg Hx    Breast cancer Neg Hx     Social History:  reports that she has been smoking cigarettes. She has a 10.00 pack-year smoking history. She has never used smokeless tobacco. She reports current alcohol use of about 1.0 standard drink per week. She reports that she does not use drugs.   Physical Exam: BP 129/84   Pulse 94   Ht '5\' 4"'$  (1.626 m)   Wt 165 lb (74.8 kg)   BMI 28.32 kg/m   Constitutional:  Well nourished. Alert and oriented, No acute distress. HEENT: [place] AT, mask in place.  Trachea  midline Cardiovascular: No clubbing, cyanosis, or edema. Respiratory: Normal respiratory effort, no increased work of breathing. GU: No CVA tenderness.  No bladder fullness or masses.  Normal external genitalia, normal pubic hair distribution, no lesions.  Normal urethral meatus, no lesions, no prolapse, no discharge.   No urethral masses, tenderness and/or tenderness. No bladder fullness, tenderness or masses. normal vagina mucosa, good estrogen effect, no discharge, no lesions, fair pelvic support, grade II cystocele and no rectocele noted.  Anus and perineum are without rashes or lesions.     Neurologic: Grossly intact, no focal deficits, moving all 4 extremities. Psychiatric: Normal mood and affect.    Laboratory Data: Urinalysis Component     Latest Ref Rng & Units 01/12/2021  Specific Gravity, UA     1.005 - 1.030 >1.030 (H)  pH, UA     5.0 - 7.5 5.5  Color, UA     Yellow Yellow  Appearance Ur     Clear Hazy (A)  Leukocytes,UA     Negative Negative  Protein,UA     Negative/Trace Negative  Glucose, UA     Negative Negative  Ketones, UA     Negative Negative  RBC, UA     Negative Negative  Bilirubin, UA     Negative Negative  Urobilinogen, Ur     0.2 - 1.0 mg/dL 0.2  Nitrite, UA     Negative Negative  Microscopic Examination      See below:   Component     Latest Ref Rng & Units 01/12/2021  WBC, UA     0 - 5 /hpf 0-5  RBC     0 - 2 /hpf 0-2  Epithelial Cells (non renal)     0 - 10 /hpf 0-10  Bacteria, UA     None seen/Few None seen  I have reviewed the labs.    Assessment & Plan:    1. Cystitis -UA is unremarkable at this visit but may be secondary to her starting the antibiotic already -Urine is sent for culture -Patient will continue the Levaquin that she has on hand and will adjust if necessary once urine culture results are available  [facility] [address], [place], [place] [address] [phone]

## 2021-01-15 LAB — CULTURE, URINE COMPREHENSIVE

## 2021-03-16 ENCOUNTER — Encounter: Payer: Self-pay | Admitting: Urology

## 2021-03-16 ENCOUNTER — Ambulatory Visit: Payer: 59 | Admitting: Urology

## 2021-03-16 ENCOUNTER — Other Ambulatory Visit: Payer: Self-pay

## 2021-03-16 VITALS — BP 144/93 | HR 80 | Ht 64.0 in | Wt 164.0 lb

## 2021-03-16 DIAGNOSIS — N390 Urinary tract infection, site not specified: Secondary | ICD-10-CM

## 2021-03-16 LAB — BLADDER SCAN AMB NON-IMAGING: Scan Result: 65

## 2021-03-16 MED ORDER — TAMSULOSIN HCL 0.4 MG PO CAPS: 0.4000 mg | ORAL_CAPSULE | Freq: Every day | ORAL | 3 refills | Status: DC

## 2021-03-16 MED ORDER — CEFUROXIME AXETIL 500 MG PO TABS: 500.0000 mg | ORAL_TABLET | Freq: Two times a day (BID) | ORAL | 3 refills | Status: DC

## 2021-03-16 NOTE — Progress Notes (Signed)
04/18/21 1:02 PM   Teresa Moss 09/19/1970 [id number]  Referring provider:  Marinda Elk, MD [facility] [place],  [place] [address]  Chief Complaint  Patient presents with   Recurrent UTI   Urological History:   1. Nephrolithiasis  -Spontaneous passage of stones  -Stone composition of 95% calcium oxalate monohydrate, 2% calcium oxalate dihydrate and 3% calcium phosphate  -24 hour metabolic work up demonstrated suboptimal urine volume, hypomagnesiuria, marketed hypocitraturia, borderline low urine pH and mild uric acid supersaturation   2. Family history of bladder cancer  -father has bladder cancer (extensive smoking history)     3. OAB  -contributing factors of age, depression -managed with Vesicare 10 mg daily  -cysto 2018 NED     4. Mixed incontinence  -Grade 2 hypermobility of the bladder neck and no stress incontinence on 2018 UDS   HPI:  Teresa Moss is a 50 y.o.female who presents today for possible UTI.   Since Thursday, she has been experiencing intense suprapubic pressure, urgency and difficulty urinating.  She also noted some mild left flank pain.  Patient denies any modifying or aggravating factors.  Patient denies any gross hematuria, dysuria or suprapubic/flank pain.  Patient denies any fevers, chills, nausea or vomiting.    She has been increasing her fluid intake and effort to stave off symptoms which has been unsuccessful.  UA few bacteria.  PVR 65 mL  PMH: Past Medical History:  Diagnosis Date   Abnormal vaginal bleeding    GERD (gastroesophageal reflux disease)    HLD (hyperlipidemia)    Kidney stone    Right   Kidney stones     Surgical History: Past Surgical History:  Procedure Laterality Date   CHOLECYSTECTOMY  2012    Home Medications:  Allergies as of 03/16/2021       Reactions   Codeine Other (See Comments)   Stimulation/paradoxical agitation   Hydrocodone Other (See Comments)   Paradoxical  agitation   Macrobid [nitrofurantoin] Other (See Comments)   Profuse Sweating, Hot flashes, and tremors   Morphine Other (See Comments)   agitation   Penicillins Other (See Comments)   Unknown Reaction- Occurred as a child   Promethazine Hcl Other (See Comments)        Medication List        Accurate as of March 16, 2021 11:59 PM. If you have any questions, ask your nurse or doctor.          buPROPion 150 MG 24 hr tablet Commonly known as: WELLBUTRIN XL   cefUROXime 500 MG tablet Commonly known as: CEFTIN Take 1 tablet (500 mg total) by mouth 2 (two) times daily with a meal. Started by: Zara Council, PA-C   Cholecalciferol 25 MCG (1000 UT) tablet Take 1,000 Units daily by mouth.   levonorgestrel 20 MCG/24HR IUD Commonly known as: MIRENA 1 each once by Intrauterine route.   lovastatin 40 MG tablet Commonly known as: MEVACOR Take 40 mg at bedtime by mouth.   solifenacin 10 MG tablet Commonly known as: VESICARE TAKE 1 TABLET BY MOUTH  DAILY   tamsulosin 0.4 MG Caps capsule Commonly known as: FLOMAX Take 1 capsule (0.4 mg total) by mouth daily. Started by: Zara Council, PA-C        Allergies:  Allergies  Allergen Reactions   Codeine Other (See Comments)    Stimulation/paradoxical agitation   Hydrocodone Other (See Comments)    Paradoxical agitation   Macrobid [Nitrofurantoin] Other (  See Comments)    Profuse Sweating, Hot flashes, and tremors   Morphine Other (See Comments)    agitation   Penicillins Other (See Comments)    Unknown Reaction- Occurred as a child   Promethazine Hcl Other (See Comments)    Family History: Family History  Problem Relation Age of Onset   Heart disease Mother    Heart disease Father    Bladder Cancer Father    Kidney disease Neg Hx    Kidney cancer Neg Hx    Breast cancer Neg Hx     Social History:  reports that she has been smoking cigarettes. She has a 10.00 pack-year smoking history. She has never used  smokeless tobacco. She reports current alcohol use of about 1.0 standard drink per week. She reports that she does not use drugs.   Physical Exam: BP (!) 144/93   Pulse 80   Ht 5\' 4"  (1.626 m)   Wt 164 lb (74.4 kg)   BMI 28.15 kg/m   Constitutional:  Well nourished. Alert and oriented, No acute distress. HEENT: [place] AT, mask in place.  Trachea midline Cardiovascular: No clubbing, cyanosis, or edema. Respiratory: Normal respiratory effort, no increased work of breathing. Neurologic: Grossly intact, no focal deficits, moving all 4 extremities. Psychiatric: Normal mood and affect.    Laboratory Data: Urinalysis Component     Latest Ref Rng & Units 03/16/2021  Specific Gravity, UA     1.005 - 1.030 1.015  pH, UA     5.0 - 7.5 6.0  Color, UA     Yellow Yellow  Appearance Ur     Clear Clear  Leukocytes,UA     Negative Trace (A)  Protein,UA     Negative/Trace Negative  Glucose, UA     Negative Negative  Ketones, UA     Negative Negative  RBC, UA     Negative Negative  Bilirubin, UA     Negative Negative  Urobilinogen, Ur     0.2 - 1.0 mg/dL 0.2  Nitrite, UA     Negative Negative  Microscopic Examination      See below:   Component     Latest Ref Rng & Units 03/16/2021  WBC, UA     0 - 5 /hpf 0-5  RBC     0 - 2 /hpf 0-2  Epithelial Cells (non renal)     0 - 10 /hpf 0-10  Bacteria, UA     None seen/Few Few  I have reviewed the labs.    Assessment & Plan:    1. Cystitis -UA benign -urine culture -will start Ceftin as patient is symptomatic -Discussed the differential diagnosis of UTI versus ureteral stone as she does have a history of nephrolithiasis and we will start tamsulosin 0.4 mg daily as well to facilitate any small stone passage -If urine culture is positive, we will treat with culture appropriate antibiotics and reassess -If urine culture is negative, we will pursue imaging studies to rule out nephrolithiasis  [facility] [address] [place], [place] [address] [phone]

## 2021-03-17 LAB — URINALYSIS, COMPLETE
Bilirubin, UA: NEGATIVE
Glucose, UA: NEGATIVE
Ketones, UA: NEGATIVE
Nitrite, UA: NEGATIVE
Protein,UA: NEGATIVE
RBC, UA: NEGATIVE
Specific Gravity, UA: 1.015 (ref 1.005–1.030)
Urobilinogen, Ur: 0.2 mg/dL (ref 0.2–1.0)
pH, UA: 6 (ref 5.0–7.5)

## 2021-03-17 LAB — MICROSCOPIC EXAMINATION

## 2021-03-19 LAB — CULTURE, URINE COMPREHENSIVE

## 2021-04-06 ENCOUNTER — Other Ambulatory Visit: Payer: Self-pay | Admitting: Urology

## 2021-04-18 ENCOUNTER — Telehealth: Payer: Self-pay | Admitting: Urology

## 2021-04-18 NOTE — Telephone Encounter (Signed)
Please let Teresa Moss know that I had sent her a MyChart message regarding her negative urine culture and that I recommended that we either get an Xray or CT scan to make sure she is not passing a stone.

## 2021-11-19 ENCOUNTER — Other Ambulatory Visit: Payer: Self-pay | Admitting: Physician Assistant

## 2021-11-19 DIAGNOSIS — Z1231 Encounter for screening mammogram for malignant neoplasm of breast: Secondary | ICD-10-CM

## 2021-12-22 ENCOUNTER — Ambulatory Visit
Admission: RE | Admit: 2021-12-22 | Discharge: 2021-12-22 | Disposition: A | Discharge status: Home / Self Care | Payer: 59 | Source: Ambulatory Visit | Attending: Physician Assistant | Admitting: Physician Assistant

## 2021-12-22 DIAGNOSIS — Z1231 Encounter for screening mammogram for malignant neoplasm of breast: Secondary | ICD-10-CM | POA: Diagnosis present

## 2022-02-01 ENCOUNTER — Other Ambulatory Visit: Payer: Self-pay | Admitting: Urology

## 2022-03-02 ENCOUNTER — Other Ambulatory Visit: Payer: Self-pay

## 2022-03-02 DIAGNOSIS — N200 Calculus of kidney: Secondary | ICD-10-CM

## 2022-03-02 NOTE — Progress Notes (Signed)
03/03/22 2:30 PM   Teresa Moss 1970-07-13 [id number]  Referring provider:  Marinda Elk, MD [place] [facility] [place],  [place] [address]  Urological History:   1. Nephrolithiasis  -Spontaneous passage of stones  -Stone composition of 95% calcium oxalate monohydrate, 2% calcium oxalate dihydrate and 3% calcium phosphate  -24 hour metabolic work up demonstrated suboptimal urine volume, hypomagnesiuria, marketed hypocitraturia, borderline low urine pH and mild uric acid supersaturation   2. Family history of bladder cancer  -father has bladder cancer (extensive smoking history)     3. OAB  -contributing factors of age, depression -managed with Vesicare 10 mg daily  -cysto 2018 NED     4. Mixed incontinence  -Grade 2 hypermobility of the bladder neck and no stress incontinence on 2018 UDS   HPI:  Teresa Moss is a 51 y.o.female who presents today for possible UTI.   On her OAB questionnaire, she indicated that she is having 1-7 daytime voids, nocturia x 1-2 and strong urge incontinence.  She is experiencing SUI.  She leaks 1-2 times weekly.  She does engage in toilet mapping on occasion.  She has been's experiencing pelvic pressure, frequency, painful urination, straining to urinate and lower back pain for about 2 weeks.  She also states that she feels her vaginal area is swollen and she has been seeing a vaginal discharge.  She states sometimes the discharge is white, sometimes it is yellow and sometimes is green.  She is not sexually active.  She does not have any vaginal itching.  UA yellow clear,specific gravity greater than 1.030, pH 5.5, 0-5 WBCs, 0-2 RBCs, 0-10 epithelial cells mucus threads are present with a few bacteria.  PVR 0 mL  KUB no stones seen   PMH: Past Medical History:  Diagnosis Date   Abnormal vaginal bleeding    GERD (gastroesophageal reflux disease)    HLD (hyperlipidemia)    Kidney stone    Right   Kidney stones      Surgical History: Past Surgical History:  Procedure Laterality Date   CHOLECYSTECTOMY  2012    Home Medications:  Allergies as of 03/03/2022       Reactions   Codeine Other (See Comments)   Stimulation/paradoxical agitation   Hydrocodone Other (See Comments)   Paradoxical agitation   Macrobid [nitrofurantoin] Other (See Comments)   Profuse Sweating, Hot flashes, and tremors   Morphine Other (See Comments)   agitation   Penicillins Other (See Comments)   Unknown Reaction- Occurred as a child   Promethazine Hcl Other (See Comments)        Medication List        Accurate as of March 03, 2022  2:30 PM. If you have any questions, ask your nurse or doctor.          STOP taking these medications    cefUROXime 500 MG tablet Commonly known as: CEFTIN       TAKE these medications    buPROPion 150 MG 24 hr tablet Commonly known as: WELLBUTRIN XL   cephALEXin 500 MG capsule Commonly known as: KEFLEX Take 1 capsule (500 mg total) by mouth 2 (two) times daily for 7 days.   Cholecalciferol 25 MCG (1000 UT) tablet Take 1,000 Units daily by mouth.   escitalopram 10 MG tablet Commonly known as: LEXAPRO Take 10 mg by mouth daily.   esomeprazole 40 MG capsule Commonly known as: NEXIUM Take 40 mg by mouth daily.   levonorgestrel  20 MCG/24HR IUD Commonly known as: MIRENA 1 each once by Intrauterine route.   lovastatin 40 MG tablet Commonly known as: MEVACOR Take 40 mg at bedtime by mouth.   solifenacin 10 MG tablet Commonly known as: VESICARE TAKE 1 TABLET BY MOUTH DAILY   tamsulosin 0.4 MG Caps capsule Commonly known as: FLOMAX Take 1 capsule (0.4 mg total) by mouth daily.        Allergies:  Allergies  Allergen Reactions   Codeine Other (See Comments)    Stimulation/paradoxical agitation   Hydrocodone Other (See Comments)    Paradoxical agitation   Macrobid [Nitrofurantoin] Other (See Comments)    Profuse Sweating, Hot flashes, and tremors    Morphine Other (See Comments)    agitation   Penicillins Other (See Comments)    Unknown Reaction- Occurred as a child   Promethazine Hcl Other (See Comments)    Family History: Family History  Problem Relation Age of Onset   Heart disease Mother    Heart disease Father    Bladder Cancer Father    Kidney disease Neg Hx    Kidney cancer Neg Hx    Breast cancer Neg Hx     Social History:  reports that she has been smoking cigarettes. She has a 10.00 pack-year smoking history. She has never used smokeless tobacco. She reports current alcohol use of about 1.0 standard drink of alcohol per week. She reports that she does not use drugs.   Physical Exam: BP 130/82   Pulse 85   Ht '5\' 4"'$  (1.626 m)   Wt 164 lb (74.4 kg)   BMI 28.15 kg/m   Constitutional:  Well nourished. Alert and oriented, No acute distress. HEENT: [place] AT, moist mucus membranes.  Trachea midline Cardiovascular: No clubbing, cyanosis, or edema. Respiratory: Normal respiratory effort, no increased work of breathing. GU: No CVA tenderness.  No bladder fullness or masses.   Normal external genitalia, normal pubic hair distribution, no lesions.  Normal urethral meatus, no lesions, no prolapse, no discharge.   No urethral masses, tenderness and/or tenderness. No bladder fullness, tenderness or masses.  vagina mucosa, good estrogen effect, no discharge, no lesions, fair pelvic support, Grade II cystocele and grade I rectocele noted.  No cervical motion tenderness.    Anus and perineum are without rashes or lesions.     Neurologic: Grossly intact, no focal deficits, moving all 4 extremities. Psychiatric: Normal mood and affect.    Laboratory Data: Urinalysis See Epic and HPI I have reviewed the labs.   Pertinent imaging  03/03/22 14:08  Scan Result 53m   Narrative & Impression  CLINICAL DATA:  Provided history: Kidney stone. Additional history provided: Patient reports a history of kidney stones, bilateral flank pain  for 1 week.   EXAM: ABDOMEN - 1 VIEW   COMPARISON:  Prior abdominal radiographs 08/04/2020 and earlier. CT abdomen/pelvis 07/18/2020.   FINDINGS: Nonobstructive bowel gas pattern. No appreciable renal or ureteral calculi. Multiple phleboliths. Intrauterine device. Surgical clips within the right upper quadrant of the abdomen. 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   IMPRESSION: No appreciable urinary tract calculi. If symptoms persist, consider a renal stone protocol CT of the abdomen/pelvis for further evaluation.   Dextrocurvature of the lumbar spine.     Electronically Signed   By: KKellie SimmeringD.O.   On: 03/06/2022 12:48    I have independently reviewed the films.  See HPI.    Assessment & Plan:  1. Suspected UTI -UA benign -urine sent for culture to rule out infection -If urine culture is positive, we will go ahead and treat with culture appropriate antibiotics, we will go ahead and treat and then reassess -If urine culture is negative, we will go ahead and get her scheduled for cystoscopy -Prescription given for Keflex 500 mg twice daily   [facility] [address] [place], [place] [address] [phone]

## 2022-03-03 ENCOUNTER — Ambulatory Visit
Admission: RE | Admit: 2022-03-03 | Discharge: 2022-03-03 | Disposition: A | Discharge status: Home / Self Care | Payer: 59 | Source: Ambulatory Visit | Attending: Urology | Admitting: Urology

## 2022-03-03 ENCOUNTER — Ambulatory Visit
Admission: RE | Admit: 2022-03-03 | Discharge: 2022-03-03 | Disposition: A | Discharge status: Home / Self Care | Payer: 59 | Attending: Urology | Admitting: Urology

## 2022-03-03 ENCOUNTER — Ambulatory Visit: Payer: 59 | Admitting: Urology

## 2022-03-03 VITALS — BP 130/82 | HR 85 | Ht 64.0 in | Wt 164.0 lb

## 2022-03-03 DIAGNOSIS — N200 Calculus of kidney: Secondary | ICD-10-CM

## 2022-03-03 DIAGNOSIS — Z8052 Family history of malignant neoplasm of bladder: Secondary | ICD-10-CM

## 2022-03-03 DIAGNOSIS — R3916 Straining to void: Secondary | ICD-10-CM

## 2022-03-03 DIAGNOSIS — N3281 Overactive bladder: Secondary | ICD-10-CM

## 2022-03-03 DIAGNOSIS — R350 Frequency of micturition: Secondary | ICD-10-CM

## 2022-03-03 DIAGNOSIS — N3941 Urge incontinence: Secondary | ICD-10-CM | POA: Diagnosis not present

## 2022-03-03 DIAGNOSIS — N898 Other specified noninflammatory disorders of vagina: Secondary | ICD-10-CM

## 2022-03-03 DIAGNOSIS — R309 Painful micturition, unspecified: Secondary | ICD-10-CM

## 2022-03-03 DIAGNOSIS — R3989 Other symptoms and signs involving the genitourinary system: Secondary | ICD-10-CM

## 2022-03-03 DIAGNOSIS — Z87442 Personal history of urinary calculi: Secondary | ICD-10-CM

## 2022-03-03 DIAGNOSIS — M545 Low back pain, unspecified: Secondary | ICD-10-CM

## 2022-03-03 LAB — URINALYSIS, COMPLETE
Bilirubin, UA: NEGATIVE
Glucose, UA: NEGATIVE
Ketones, UA: NEGATIVE
Leukocytes,UA: NEGATIVE
Nitrite, UA: NEGATIVE
Protein,UA: NEGATIVE
RBC, UA: NEGATIVE
Specific Gravity, UA: 1.03 (ref 1.005–1.030)
Urobilinogen, Ur: 0.2 mg/dL (ref 0.2–1.0)
pH, UA: 5.5 (ref 5.0–7.5)

## 2022-03-03 LAB — MICROSCOPIC EXAMINATION

## 2022-03-03 LAB — BLADDER SCAN AMB NON-IMAGING

## 2022-03-03 MED ORDER — CEPHALEXIN 500 MG PO CAPS: 500.0000 mg | ORAL_CAPSULE | Freq: Two times a day (BID) | ORAL | 0 refills | Status: AC

## 2022-03-07 ENCOUNTER — Encounter: Payer: Self-pay | Admitting: Urology

## 2022-03-07 LAB — CULTURE, URINE COMPREHENSIVE

## 2022-04-19 ENCOUNTER — Other Ambulatory Visit: Payer: Self-pay | Admitting: Urology

## 2022-06-23 ENCOUNTER — Encounter: Payer: Self-pay | Admitting: Urology

## 2022-06-23 ENCOUNTER — Ambulatory Visit: Payer: 59 | Admitting: Urology

## 2022-06-23 VITALS — BP 144/88 | HR 83 | Ht 64.0 in | Wt 170.0 lb

## 2022-06-23 DIAGNOSIS — Z87440 Personal history of urinary (tract) infections: Secondary | ICD-10-CM

## 2022-06-23 DIAGNOSIS — R3989 Other symptoms and signs involving the genitourinary system: Secondary | ICD-10-CM | POA: Diagnosis not present

## 2022-06-23 NOTE — Progress Notes (Signed)
   06/23/22  CC:  Chief Complaint  Patient presents with   Cysto    HPI: 52 year old female who presents today for cystoscopy.  Please see previous notes for details.  She reports history of urinary tract typel in the absence of infection.  Blood pressure (!) 144/88, pulse 83, height '5\' 4"'$  (1.626 m), weight 170 lb (77.1 kg). NED. A&Ox3.   No respiratory distress   Abd soft, NT, ND Normal external genitalia with patent urethral meatus  Cystoscopy Procedure Note  Patient identification was confirmed, informed consent was obtained, and patient was prepped using Betadine solution.  Lidocaine jelly was administered per urethral meatus.    Procedure: - Flexible cystoscope introduced, without any difficulty.   - Thorough search of the bladder revealed:    normal urethral meatus    normal urothelium    no stones    no ulcers     no tumors    no urethral polyps    no trabeculation  - Ureteral orifices were normal in position and appearance.  Post-Procedure: - Patient tolerated the procedure well  Assessment/ Plan:  1. Microscopic UTI Cystoscopy is unremarkable - Urinalysis, Complete    F/u Zara Council 6 mo   Hollice Espy, MD

## 2022-06-24 LAB — URINALYSIS, COMPLETE
Bilirubin, UA: NEGATIVE
Glucose, UA: NEGATIVE
Ketones, UA: NEGATIVE
Leukocytes,UA: NEGATIVE
Nitrite, UA: NEGATIVE
RBC, UA: NEGATIVE
Specific Gravity, UA: >1.03 — ABNORMAL HIGH (ref 1.005–1.030)
Urobilinogen, Ur: 0.2 mg/dL (ref 0.2–1.0)
pH, UA: 6 (ref 5.0–7.5)

## 2022-06-24 LAB — MICROSCOPIC EXAMINATION: Epithelial Cells (non renal): >10 /hpf — AB (ref 0–10)

## 2022-12-08 ENCOUNTER — Other Ambulatory Visit: Payer: Self-pay | Admitting: Physician Assistant

## 2022-12-08 DIAGNOSIS — Z1231 Encounter for screening mammogram for malignant neoplasm of breast: Secondary | ICD-10-CM

## 2022-12-21 NOTE — Progress Notes (Unsigned)
12/21/22 8:39 AM   Teresa Moss 1971-01-16 [id number]  Referring provider:  Patrice Paradise, MD [address] [facility] [place],  [place] [address]  Urological History:   1. Nephrolithiasis  -Spontaneous passage of stones  -Stone composition of 95% calcium oxalate monohydrate, 2% calcium oxalate dihydrate and 3% calcium phosphate  -24 hour metabolic work up demonstrated suboptimal urine volume, hypomagnesiuria, marketed hypocitraturia, borderline low urine pH and mild uric acid supersaturation  -KUB (02/2022) - no stones seen   2. Family history of bladder cancer  -father has bladder cancer (extensive smoking history)     3. OAB  -contributing factors of age, depression -managed with Vesicare 10 mg daily  -cysto (2018) - NED    -cysto (06/2022) - NED   4. Mixed incontinence  -Grade 2 hypermobility of the bladder neck and no stress incontinence on 2018 UDS   HPI:  Teresa Moss is a 52 y.o.female who presents today for 6 months follow up.   Previous records reviewed.   She is having 1 to 7 daytime voids, nocturia x 1-2 and mild urgency.  She is having SUI, 1 to 2 times weekly.   She engages in toilet mapping.    Patient denies any modifying or aggravating factors.  Patient denies any recent UTI's, gross hematuria, dysuria or suprapubic/flank pain.  Patient denies any fevers, chills, nausea or vomiting.   She is at goal with Vesicare 10 mg daily.    PMH: Past Medical History:  Diagnosis Date   Abnormal vaginal bleeding    GERD (gastroesophageal reflux disease)    HLD (hyperlipidemia)    Kidney stone    Right   Kidney stones     Surgical History: Past Surgical History:  Procedure Laterality Date   CHOLECYSTECTOMY  2012    Home Medications:  Allergies as of 12/22/2022       Reactions   Codeine Other (See Comments)   Stimulation/paradoxical agitation   Hydrocodone Other (See Comments)   Paradoxical agitation   Macrobid [nitrofurantoin]  Other (See Comments)   Profuse Sweating, Hot flashes, and tremors   Morphine Other (See Comments)   agitation   Penicillins Other (See Comments)   Unknown Reaction- Occurred as a child   Promethazine Hcl Other (See Comments)        Medication List        Accurate as of December 21, 2022  8:39 AM. If you have any questions, ask your nurse or doctor.          buPROPion 150 MG 24 hr tablet Commonly known as: WELLBUTRIN XL   Cholecalciferol 25 MCG (1000 UT) tablet Take 1,000 Units daily by mouth.   co-enzyme Q-10 30 MG capsule   escitalopram 10 MG tablet Commonly known as: LEXAPRO Take 10 mg by mouth daily.   esomeprazole 40 MG capsule Commonly known as: NEXIUM Take 40 mg by mouth daily.   FISH OIL PO Take by mouth.   levonorgestrel 20 MCG/24HR IUD Commonly known as: MIRENA 1 each once by Intrauterine route.   lovastatin 40 MG tablet Commonly known as: MEVACOR Take 40 mg at bedtime by mouth.   solifenacin 10 MG tablet Commonly known as: VESICARE TAKE 1 TABLET BY MOUTH DAILY        Allergies:  Allergies  Allergen Reactions   Codeine Other (See Comments)    Stimulation/paradoxical agitation   Hydrocodone Other (See Comments)    Paradoxical agitation   Macrobid [Nitrofurantoin] Other (See Comments)  Profuse Sweating, Hot flashes, and tremors   Morphine Other (See Comments)    agitation   Penicillins Other (See Comments)    Unknown Reaction- Occurred as a child   Promethazine Hcl Other (See Comments)    Family History: Family History  Problem Relation Age of Onset   Heart disease Mother    Heart disease Father    Bladder Cancer Father    Kidney disease Neg Hx    Kidney cancer Neg Hx    Breast cancer Neg Hx     Social History:  reports that she has been smoking cigarettes. She has a 10 pack-year smoking history. She has never used smokeless tobacco. She reports current alcohol use of about 1.0 standard drink of alcohol per week. She reports  that she does not use drugs.   Physical Exam: There were no vitals taken for this visit.  Constitutional:  Well nourished. Alert and oriented, No acute distress. HEENT: [place] AT, moist mucus membranes.  Trachea midline Cardiovascular: No clubbing, cyanosis, or edema. Respiratory: Normal respiratory effort, no increased work of breathing. Neurologic: Grossly intact, no focal deficits, moving all 4 extremities. Psychiatric: Normal mood and affect.     Laboratory Data: Comprehensive Metabolic Panel (CMP) Order: [id number] Component Ref Range & Units 5 mo ago  Glucose 70 - 110 mg/dL 81  Sodium 956 - 213 mmol/L 136  Potassium 3.6 - 5.1 mmol/L 4.1  Chloride 97 - 109 mmol/L 100  Carbon Dioxide (CO2) 22.0 - 32.0 mmol/L 31.6  Urea Nitrogen (BUN) 7 - 25 mg/dL 9  Creatinine 0.6 - 1.1 mg/dL 0.9  Glomerular Filtration Rate (eGFR) >60 mL/min/1.73sq m 77  Comment: CKD-EPI (2021) does not include patient's race in the calculation of eGFR.  Monitoring changes of plasma creatinine and eGFR over time is useful for monitoring kidney function.  Interpretive Ranges for eGFR (CKD-EPI 2021):  eGFR:       >60 mL/min/1.73 sq. m - Normal eGFR:       30-59 mL/min/1.73 sq. m - Moderately Decreased eGFR:       15-29 mL/min/1.73 sq. m  - Severely Decreased eGFR:       < 15 mL/min/1.73 sq. m  - Kidney Failure   Note: These eGFR calculations do not apply in acute situations when eGFR is changing rapidly or patients on dialysis.  Calcium 8.7 - 10.3 mg/dL 9.4  AST 8 - 39 U/L 13  ALT 5 - 38 U/L 13  Alk Phos (alkaline Phosphatase) 34 - 104 U/L 71  Albumin 3.5 - 4.8 g/dL 4.2  Bilirubin, Total 0.3 - 1.2 mg/dL 0.5  Protein, Total 6.1 - 7.9 g/dL 6.8  A/G Ratio 1.0 - 5.0 gm/dL 1.6  Resulting Agency [facility]   Specimen Collected: 07/01/22 16:42   Performed by: [facility] Last Resulted: 07/01/22 17:54  Received From: [facility]  Result Received:  11/30/22 16:38   Hemoglobin A1C Order: [id number] Component Ref Range & Units 5 mo ago  Hemoglobin A1C 4.2 - 5.6 % 5.5  Average Blood Glucose (Calc) mg/dL 629  Resulting Agency [facility]  Narrative Performed by [facility] Normal Range:    4.2 - 5.6% Increased Risk:  5.7 - 6.4% Diabetes:        >= 6.5% Glycemic Control for adults with diabetes:  <7%    Specimen Collected: 07/01/22 16:42   Performed by: [facility] Last Resulted: 07/04/22 10:23  Received From: [facility]  Result Received: 11/30/22 16:38   Urinalysis Urinalysis w/Microscopic Order: [id number] Component Ref Range & Units 5 mo ago  Color Colorless, Straw, Light Yellow, Yellow, Dark Yellow Colorless  Clarity Clear Clear  Specific Gravity 1.005 - 1.030 1.005  pH, Urine 5.0 - 8.0 6.0  Protein, Urinalysis Negative mg/dL Negative  Glucose, Urinalysis Negative mg/dL Negative  Ketones, Urinalysis Negative mg/dL Negative  Blood, Urinalysis Negative Negative  Nitrite, Urinalysis Negative Negative  Leukocyte Esterase, Urinalysis Negative Negative  Bilirubin, Urinalysis Negative Negative  Urobilinogen, Urinalysis 0.2 - 1.0 mg/dL 0.2  WBC, UA <=5 /hpf <1  Red Blood Cells, Urinalysis <=3 /hpf 1  Bacteria, Urinalysis 0 - 5 /hpf 0-5  Squamous Epithelial Cells, Urinalysis /hpf 0  Resulting Agency [facility]   Specimen Collected: 07/01/22 16:42   Performed by: [facility] Last Resulted: 07/01/22 16:54  Received From: [facility]  Result Received: 11/30/22 16:38  I have reviewed the labs.   Pertinent imaging N/A  Assessment & Plan:    1. OAB -At goal with Vesicare 10 mg daily  2. Mixed urinary incontinence -Mild bother  3.  Nephrolithiasis -no symptoms of renal colic  Michiel Cowboy, PA-C   [facility] [address], [address] [place], [place]  [address] [phone]

## 2022-12-22 ENCOUNTER — Ambulatory Visit: Payer: 59 | Admitting: Urology

## 2022-12-22 ENCOUNTER — Encounter: Payer: Self-pay | Admitting: Urology

## 2022-12-22 VITALS — BP 94/69 | HR 94 | Wt 173.0 lb

## 2022-12-22 DIAGNOSIS — Z87442 Personal history of urinary calculi: Secondary | ICD-10-CM | POA: Diagnosis not present

## 2022-12-22 DIAGNOSIS — N3281 Overactive bladder: Secondary | ICD-10-CM | POA: Diagnosis not present

## 2022-12-22 DIAGNOSIS — N3946 Mixed incontinence: Secondary | ICD-10-CM | POA: Diagnosis not present

## 2022-12-22 DIAGNOSIS — N200 Calculus of kidney: Secondary | ICD-10-CM

## 2022-12-22 MED ORDER — SOLIFENACIN SUCCINATE 10 MG PO TABS: 10.0000 mg | ORAL_TABLET | Freq: Every day | ORAL | 3 refills | Status: DC

## 2022-12-28 ENCOUNTER — Ambulatory Visit
Admission: RE | Admit: 2022-12-28 | Discharge: 2022-12-28 | Disposition: A | Discharge status: Home / Self Care | Payer: 59 | Source: Ambulatory Visit | Attending: Physician Assistant | Admitting: Physician Assistant

## 2022-12-28 DIAGNOSIS — Z1231 Encounter for screening mammogram for malignant neoplasm of breast: Secondary | ICD-10-CM | POA: Diagnosis not present

## 2023-11-24 ENCOUNTER — Other Ambulatory Visit: Payer: Self-pay | Admitting: Urology

## 2023-12-22 ENCOUNTER — Ambulatory Visit: Payer: Self-pay | Admitting: Urology

## 2023-12-26 NOTE — Progress Notes (Unsigned)
 12/28/23 10:32 AM   Rexene RAMAN Templeman 09-Mar-1971 [id number]  Referring provider:  Marikay Eva POUR, PA [address] [facility] [address] [facility] [address] [facility] [place],  [place] [address]  Urological History:  1. Nephrolithiasis  -Spontaneous passage of stones  -Stone composition of 95% calcium oxalate monohydrate, 2% calcium oxalate dihydrate and 3% calcium phosphate  -24 hour metabolic work up demonstrated suboptimal urine volume, hypomagnesiuria, marketed hypocitraturia, borderline low urine pH and mild uric acid supersaturation  -KUB (02/2022) - no stones seen   2. Family history of bladder cancer  -father has bladder cancer (extensive smoking history)     3. OAB  -contributing factors of age, depression -managed with Vesicare  10 mg daily  -cysto (2018) - NED    -cysto (06/2022) - NED   4. Mixed incontinence  -Grade 2 hypermobility of the bladder neck and no stress incontinence on 2018 UDS   HPI:  JAYLE SOLARZ is a 53 y.o.female who presents today for 12  months follow up.   Previous records reviewed.   She has had 1-7 daytime voids, 1-2 episodes of nocturia with strong urge to urinate.  She has stress urinary continence.  She leaks 1-2 times weekly.  She does not wear absorbent products for urinary leakage.  She does not limit fluid intake.  She does engage in toilet mapping.  Patient denies any modifying or aggravating factors.  Patient denies any recent UTI's, gross hematuria, dysuria or suprapubic/flank pain.  Patient denies any fevers, chills, nausea or vomiting.    Serum creatinine was normal at 1.0, eGFR 68 in March.   Hbg A1c was slightly elevated at 5.7.     She is currently perimenopausal.  She is experiencing hot flashes.  Her urinary symptoms are more bothersome.   PMH: Past Medical History:  Diagnosis Date   Abnormal vaginal bleeding    GERD (gastroesophageal reflux disease)    HLD (hyperlipidemia)    Kidney stone    Right   Kidney stones     Surgical  History: Past Surgical History:  Procedure Laterality Date   CHOLECYSTECTOMY  2012    Home Medications:  Allergies as of 12/28/2023       Reactions   Codeine Other (See Comments)   Stimulation/paradoxical agitation   Hydrocodone Other (See Comments)   Paradoxical agitation   Macrobid  [nitrofurantoin ] Other (See Comments)   Profuse Sweating, Hot flashes, and tremors   Morphine Other (See Comments)   agitation   Penicillins Other (See Comments)   Unknown Reaction- Occurred as a child   Promethazine Hcl Other (See Comments)        Medication List        Accurate as of December 28, 2023 10:32 AM. If you have any questions, ask your nurse or doctor.          buPROPion 150 MG 24 hr tablet Commonly known as: WELLBUTRIN XL   Cholecalciferol 25 MCG (1000 UT) tablet Take 1,000 Units daily by mouth.   co-enzyme Q-10 30 MG capsule   escitalopram 10 MG tablet Commonly known as: LEXAPRO Take 10 mg by mouth daily.   esomeprazole 40 MG capsule Commonly known as: NEXIUM Take 40 mg by mouth daily.   FISH OIL PO Take by mouth.   levonorgestrel 20 MCG/24HR IUD Commonly known as: MIRENA 1 each once by Intrauterine route.   lovastatin 40 MG tablet Commonly known as: MEVACOR Take 40 mg at bedtime by mouth.   solifenacin  10 MG tablet Commonly known as: VESICARE   TAKE 1 TABLET BY MOUTH DAILY        Allergies:  Allergies  Allergen Reactions   Codeine Other (See Comments)    Stimulation/paradoxical agitation   Hydrocodone Other (See Comments)    Paradoxical agitation   Macrobid  [Nitrofurantoin ] Other (See Comments)    Profuse Sweating, Hot flashes, and tremors   Morphine Other (See Comments)    agitation   Penicillins Other (See Comments)    Unknown Reaction- Occurred as a child   Promethazine Hcl Other (See Comments)    Family History: Family History  Problem Relation Age of Onset   Heart disease Mother    Heart disease Father    Bladder Cancer Father     Kidney disease Neg Hx    Kidney cancer Neg Hx    Breast cancer Neg Hx     Social History:  reports that she has been smoking cigarettes. She has a 10 pack-year smoking history. She has never used smokeless tobacco. She reports current alcohol use of about 1.0 standard drink of alcohol per week. She reports that she does not use drugs.   Physical Exam: BP (!) 166/94   Pulse 99   Ht 5' 4 (1.626 m)   Wt 190 lb (86.2 kg)   BMI 32.61 kg/m   Constitutional:  Well nourished. Alert and oriented, No acute distress. HEENT: [place] AT, moist mucus membranes.  Trachea midline Cardiovascular: No clubbing, cyanosis, or edema. Respiratory: Normal respiratory effort, no increased work of breathing. Neurologic: Grossly intact, no focal deficits, moving all 4 extremities. Psychiatric: Normal mood and affect.    Laboratory Data: See EPIC and HPI I have reviewed the labs.   Pertinent imaging N/A  Assessment & Plan:    1. OAB -At goal with Vesicare  10 mg daily  2. Mixed urinary incontinence/GSM - She has becoming more bothered with her urinary symptoms and has a strong urgency - Since she is perimenopausal, we will go ahead and start vaginal estrogen cream to help preserve vaginal health going forward, I advised her to place a small amount of the vaginal cream (the size of a small pea) to just inside the vaginal introitus on Monday, Wednesday and Fridays  3.  Nephrolithiasis -no symptoms of renal colic  Follow up in one year   CLOTILDA CORNWALL, PA-C   [facility] [address] [place], [place] [address] [phone]

## 2023-12-28 ENCOUNTER — Encounter: Payer: Self-pay | Admitting: Urology

## 2023-12-28 ENCOUNTER — Ambulatory Visit: Admitting: Urology

## 2023-12-28 VITALS — BP 166/94 | HR 99 | Ht 64.0 in | Wt 190.0 lb

## 2023-12-28 DIAGNOSIS — N952 Postmenopausal atrophic vaginitis: Secondary | ICD-10-CM | POA: Diagnosis not present

## 2023-12-28 DIAGNOSIS — N200 Calculus of kidney: Secondary | ICD-10-CM

## 2023-12-28 DIAGNOSIS — N3281 Overactive bladder: Secondary | ICD-10-CM | POA: Diagnosis not present

## 2023-12-28 DIAGNOSIS — N3946 Mixed incontinence: Secondary | ICD-10-CM | POA: Diagnosis not present

## 2023-12-28 MED ORDER — ESTRADIOL 0.1 MG/GM VA CREA: TOPICAL_CREAM | VAGINAL | 12 refills | Status: AC

## 2024-01-11 ENCOUNTER — Emergency Department
Admission: EM | Admit: 2024-01-11 | Discharge: 2024-01-12 | Disposition: A | Discharge status: Home / Self Care | Attending: Emergency Medicine | Admitting: Emergency Medicine

## 2024-01-11 ENCOUNTER — Other Ambulatory Visit: Payer: Self-pay

## 2024-01-11 DIAGNOSIS — D72829 Elevated white blood cell count, unspecified: Secondary | ICD-10-CM | POA: Insufficient documentation

## 2024-01-11 DIAGNOSIS — R42 Dizziness and giddiness: Secondary | ICD-10-CM | POA: Diagnosis present

## 2024-01-11 LAB — COMPREHENSIVE METABOLIC PANEL WITH GFR
ALT: 16 U/L (ref 0–44)
AST: 18 U/L (ref 15–41)
Albumin: 3.7 g/dL (ref 3.5–5.0)
Alkaline Phosphatase: 75 U/L (ref 38–126)
Anion gap: 8 (ref 5–15)
BUN: 12 mg/dL (ref 6–20)
CO2: 26 mmol/L (ref 22–32)
Calcium: 8.6 mg/dL — ABNORMAL LOW (ref 8.9–10.3)
Chloride: 101 mmol/L (ref 98–111)
Creatinine, Ser: 1.05 mg/dL — ABNORMAL HIGH (ref 0.44–1.00)
GFR, Estimated: >60 mL/min (ref >60)
Glucose, Bld: 100 mg/dL — ABNORMAL HIGH (ref 70–99)
Potassium: 3.7 mmol/L (ref 3.5–5.1)
Sodium: 135 mmol/L (ref 135–145)
Total Bilirubin: 0.6 mg/dL (ref 0.0–1.2)
Total Protein: 7.1 g/dL (ref 6.5–8.1)

## 2024-01-11 LAB — CBC
HCT: 42.7 % (ref 36.0–46.0)
Hemoglobin: 14.4 g/dL (ref 12.0–15.0)
MCH: 29.9 pg (ref 26.0–34.0)
MCHC: 33.7 g/dL (ref 30.0–36.0)
MCV: 88.8 fL (ref 80.0–100.0)
Platelets: 293 K/uL (ref 150–400)
RBC: 4.81 MIL/uL (ref 3.87–5.11)
RDW: 12.8 % (ref 11.5–15.5)
WBC: 11.8 K/uL — ABNORMAL HIGH (ref 4.0–10.5)
nRBC: 0 % (ref 0.0–0.2)

## 2024-01-11 LAB — URINALYSIS, ROUTINE W REFLEX MICROSCOPIC
Bilirubin Urine: NEGATIVE
Glucose, UA: NEGATIVE mg/dL
Hgb urine dipstick: NEGATIVE
Ketones, ur: NEGATIVE mg/dL
Leukocytes,Ua: NEGATIVE
Nitrite: NEGATIVE
Protein, ur: NEGATIVE mg/dL
Specific Gravity, Urine: 1.005 (ref 1.005–1.030)
pH: 6 (ref 5.0–8.0)

## 2024-01-11 LAB — CBG MONITORING, ED: Glucose-Capillary: 93 mg/dL (ref 70–99)

## 2024-01-11 LAB — PREGNANCY, URINE: Preg Test, Ur: NEGATIVE

## 2024-01-11 NOTE — ED Triage Notes (Signed)
 Pt arrives via EMS from local yoga studio; pt felt faint after session

## 2024-01-11 NOTE — ED Provider Notes (Signed)
 [facility] Provider Note    Event Date/Time   First MD Initiated Contact with Patient 01/11/24 2313     (approximate)   History   No chief complaint on file.   HPI  Teresa Moss is a 53 y.o. female   Past medical history of no significant past medical history presents Emergency Department with lightheadedness after sitting up from her yoga session earlier today.  She just got over a viral URI symptoms 1 week ago and went to yoga today, had a full session and was lying down in a supine resting pose and when she sat up she felt lightheaded.  Feeling better when she laid back down.  When she tried to sit back up and get up again she felt lightheaded again.  With rest this feeling subsided.  She denies any recent GI losses, has had adequate p.o. intake, and denies any associated shortness of breath, palpitations, chest pain with her lightheadedness.  She feels well now.  Independent Historian contributed to assessment above: Family member at bedside corroborates information above    Physical Exam   Triage Vital Signs: ED Triage Vitals  Encounter Vitals Group     BP 01/11/24 1940 (!) 160/90     Girls Systolic BP Percentile --      Girls Diastolic BP Percentile --      Boys Systolic BP Percentile --      Boys Diastolic BP Percentile --      Pulse Rate 01/11/24 1940 88     Resp 01/11/24 1940 18     Temp 01/11/24 1940 99.8 F (37.7 C)     Temp src --      SpO2 01/11/24 1933 98 %     Weight 01/11/24 1940 187 lb (84.8 kg)     Height 01/11/24 1940 5' 4 (1.626 m)     Head Circumference --      Peak Flow --      Pain Score 01/11/24 1940 0     Pain Loc --      Pain Education --      Exclude from Growth Chart --     Most recent vital signs: Vitals:   01/11/24 2330 01/11/24 2338  BP: (!) 143/87   Pulse: 79   Resp: 13   Temp: 98.9 F (37.2 C)   SpO2: 99% 99%    General: Awake, no distress.  CV:  Good peripheral perfusion.  Resp:  Normal effort.   Abd:  No distention.  Other:  Well-appearing woman no acute distress with normal vital signs, slightly hypertensive.  No respiratory distress, clear lungs.  Appears euvolemic overall.  No focal neurologic deficits, steady gait, normal finger-nose coordination, no facial asymmetry or dysarthria, no motor or sensory deficits.   ED Results / Procedures / Treatments   Labs (all labs ordered are listed, but only abnormal results are displayed) Labs Reviewed  COMPREHENSIVE METABOLIC PANEL WITH GFR - Abnormal; Notable for the following components:      Result Value   Glucose, Bld 100 (*)    Creatinine, Ser 1.05 (*)    Calcium 8.6 (*)    All other components within normal limits  CBC - Abnormal; Notable for the following components:   WBC 11.8 (*)    All other components within normal limits  URINALYSIS, ROUTINE W REFLEX MICROSCOPIC - Abnormal; Notable for the following components:   Color, Urine STRAW (*)    APPearance CLEAR (*)    All  other components within normal limits  PREGNANCY, URINE  CBG MONITORING, ED     I ordered and reviewed the above labs they are notable for cell counts electrolytes/unremarkable  EKG  ED ECG REPORT I, Ginnie Shams, the attending physician, personally viewed and interpreted this ECG.   Date: 01/11/2024  EKG Time: 1942  Rate: 88  Rhythm: sinus  Axis: nl  Intervals:nl  ST&T Change: no stemi     PROCEDURES:  Critical Care performed: No  Procedures   MEDICATIONS ORDERED IN ED: Medications - No data to display   IMPRESSION / MDM / ASSESSMENT AND PLAN / ED COURSE  I reviewed the triage vital signs and the nursing notes.                                Patient's presentation is most consistent with acute presentation with potential threat to life or bodily function.  Differential diagnosis includes, but is not limited to, orthostatic lightheadedness due to orthostatic hypotension, dysrhythmia, dehydration, infection, considered but less  likely stroke   The patient is on the cardiac monitor to evaluate for evidence of arrhythmia and/or significant heart rate changes.  MDM:    I think she got lightheaded after her yoga session due to orthostatic hypotension.   She feels better now.  There are no acute neurologic deficits to suggest stroke.  She does not look markedly dehydrated.  Her vital signs are normal.  Her lab testing is normal.  Her EKG shows no dysrhythmia.  She has been otherwise well, has recovered from her viral illness, and has no other infectious symptoms.    I considered hospitalization for admission or observation however given her normal vital signs, resolved symptoms, unremarkable workup at that she can go home and follow-up with her PMD.        FINAL CLINICAL IMPRESSION(S) / ED DIAGNOSES   Final diagnoses:  Orthostatic lightheadedness     Rx / DC Orders   ED Discharge Orders     None        Note:  This document was prepared using Dragon voice recognition software and may include unintentional dictation errors.    Shams Ginnie, MD 01/12/24 [phone]

## 2024-01-11 NOTE — ED Triage Notes (Signed)
 Pt was at yoga tonight, she says she sat up and she became very dizzy and could not sit up. Dizziness has improved but not resolved. No LOC or vomiting. Clear speech, MAE x4. She was sick last week with cold symptoms, saw her provider for the same, dx with viral infection.

## 2024-01-12 NOTE — Discharge Instructions (Addendum)
 Fortunately your evaluation in the Emergency Department did not show any emergent conditions account for your dizziness when you got up from yoga.  I think it was a combination of a little bit of dehydration as well as positional changes that caused the blood flow to your head to temporarily decrease causing your symptoms.  He had no signs of stroke fortunately and all your blood tests and heart tests were normal today.  Thank you for choosing us  for your health care today!  Please see your primary doctor this week for a follow up appointment.   If you have any new, worsening, or unexpected symptoms call your doctor right away or come back to the emergency department for reevaluation.  It was my pleasure to care for you today.   Ginnie EDISON Cyrena, MD

## 2024-02-14 ENCOUNTER — Other Ambulatory Visit: Payer: Self-pay | Admitting: Physician Assistant

## 2024-02-14 DIAGNOSIS — Z1231 Encounter for screening mammogram for malignant neoplasm of breast: Secondary | ICD-10-CM

## 2024-02-15 ENCOUNTER — Ambulatory Visit
Admission: RE | Admit: 2024-02-15 | Discharge: 2024-02-15 | Disposition: A | Discharge status: Home / Self Care | Source: Ambulatory Visit | Attending: Physician Assistant | Admitting: Physician Assistant

## 2024-02-15 DIAGNOSIS — Z1231 Encounter for screening mammogram for malignant neoplasm of breast: Secondary | ICD-10-CM | POA: Diagnosis present

## 2024-12-27 ENCOUNTER — Ambulatory Visit: Admitting: Urology
